# Patient Record
Sex: Female | Born: 2000 | Race: Black or African American | Hispanic: No | Marital: Single | Smoking: Never smoker
Health system: Southern US, Community
[De-identification: ages and names within clinical notes are randomized; demographics above are authoritative.]

## PROBLEM LIST (undated history)

## (undated) ENCOUNTER — Emergency Department (HOSPITAL_COMMUNITY): Admission: EM | Payer: Medicaid Other

## (undated) ENCOUNTER — Inpatient Hospital Stay (HOSPITAL_COMMUNITY): Payer: Self-pay

## (undated) DIAGNOSIS — Z30017 Encounter for initial prescription of implantable subdermal contraceptive: Secondary | ICD-10-CM

## (undated) DIAGNOSIS — T7840XA Allergy, unspecified, initial encounter: Secondary | ICD-10-CM

## (undated) DIAGNOSIS — R7303 Prediabetes: Secondary | ICD-10-CM

## (undated) DIAGNOSIS — J45909 Unspecified asthma, uncomplicated: Secondary | ICD-10-CM

## (undated) DIAGNOSIS — Z3009 Encounter for other general counseling and advice on contraception: Secondary | ICD-10-CM

## (undated) HISTORY — DX: Encounter for other general counseling and advice on contraception: Z30.09

## (undated) HISTORY — PX: NO PAST SURGERIES: SHX2092

## (undated) HISTORY — DX: Prediabetes: R73.03

## (undated) HISTORY — DX: Encounter for initial prescription of implantable subdermal contraceptive: Z30.017

---
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
## 2020-01-27 ENCOUNTER — Other Ambulatory Visit: Payer: Self-pay

## 2020-01-27 ENCOUNTER — Encounter: Payer: Self-pay | Admitting: *Deleted

## 2020-01-27 ENCOUNTER — Ambulatory Visit (INDEPENDENT_AMBULATORY_CARE_PROVIDER_SITE_OTHER): Payer: Medicaid Other | Admitting: Advanced Practice Midwife

## 2020-01-27 MED ORDER — NORETHINDRONE 0.35 MG PO TABS: 1.0000 | ORAL_TABLET | Freq: Every day | ORAL | 11 refills | Status: DC

## 2020-01-27 NOTE — Patient Instructions (Signed)
[facility]--website where you can make a virtual appointment for some counseling

## 2020-01-27 NOTE — Progress Notes (Signed)
Post Partum Visit Note  Rose Arnold is a 19 y.o. G18P1001 female who presents for a postpartum visit. She is 5 weeks postpartum following a normal spontaneous vaginal delivery.  I have fully reviewed the prenatal and intrapartum course. The delivery was at 40.2 gestational weeks.  Anesthesia: epidural. Postpartum course has been uneventful.   FOB is still sad/salty about her yelling at him during delivery.  Discussed that her actions were normal, he may have some depression issues contributing Baby is doing well. Baby is feeding by both breast and bottle - Similac Advance. Bleeding no bleeding. Bowel function is normal. Bladder function is normal. Patient is not sexually active. Contraception method is Nexplanon. Placed in hospital. Wants it removed (several reasons given:  Gained 27# (rather unlikely, clothes are still baggy), wants to have a baby in 1-2 years (discussed that taking it out now would bring her fertility back in a few weeks).  However, still wants it removed and to take POPs. Postpartum depression screening: negative.    Review of Systems Pertinent items are noted in HPI.   Objective:  Blood pressure 122/84, pulse 82, weight 127 lb 3.2 oz (57.7 kg), last menstrual period 03/14/2019, currently breastfeeding.  General:  alert, cooperative, and no distress   Breasts:  negative  Lungs: Normal respiratory effort  Heart:  regular rate and rhythm  Abdomen: soft, non-tender,    Vulva:  not evaluated  Vagina: not evaluated  Cervix:  normal  Corpus: Well involuted  Adnexa:  not evaluated  Rectal Exam: no hemorrhoids        Assessment:    Normal.  postpartum exam. Pap smear not done at today's visit.   Plan:   Essential components of care per ACOG recommendations:  1.  Mood and well being: Patient with negative depression screening today. Reviewed local resources for support.   Recommended [facility] for FOB/partner counseling.  - Patient does not use tobacco. If using tobacco we  discussed reduction and for recently cessation risk of relapse - hx of drug use? No   If yes, discussed support systems  2. Infant care and feeding:   -Patient currently breastmilk feeding? Yes If breastmilk feeding discussed return to work and pumping. If needed, patient was provided letter for work to allow for every 2-3 hr pumping breaks, and to be granted a private location to express breastmilk and refrigerated area to store breastmilk. Reviewed importance of draining breast regularly to support lactation. -Social determinants of health (SDOH) reviewed in EPIC. No concernsThe following needs were identified   3. Sexuality, contraception and birth spacing - Patient does want a pregnancy in the next year.  Desired family size is 6 children.  - Reviewed forms of contraception in tiered fashion. Patient desired oral progesterone-only contraceptive today.   - Discussed birth spacing of 18 months  4. Sleep and fatigue -Encouraged family/partner/community support of 4 hrs of uninterrupted sleep to help with mood and fatigue  5. Physical Recovery  - Discussed patients delivery and complications - Patient had a 0 degree laceration, perineal healing reviewed. Patient expressed understanding - Patient has urinary incontinence? No Patient was referred to pelvic floor PT  - Patient is safe to resume physical and sexual activity  6.  Health Maintenance - Last pap smear done N/A  7.  Nexplanon removal:   Patient given informed consent for removal of her Nexplanon, time out was performed.  Signed copy in the chart.  Appropriate time out taken. Implanon site identified.  Area prepped  in usual sterile fashon. One cc of 1% lidocaine was used to anesthetize the area at the distal end of the implant. A small stab incision was made right beside the implant on the distal portion.  The Nexplanon rod was grasped using hemostats and removed without difficulty.  There was less than 3 cc blood loss. There were  no complications.  Steri-strips were applied over the small incision.  A pressure bandage was applied to reduce any bruising.  The patient tolerated the procedure well and was given post procedure instructions.   Micronior sent to pharmacy_-strict dosage discussed, BU for 2 weeks   Jacklyn Shell, CNM [facility] [employer], [facility]

## 2020-02-24 ENCOUNTER — Ambulatory Visit: Payer: Medicaid Other | Admitting: Adult Health

## 2020-04-20 ENCOUNTER — Other Ambulatory Visit: Payer: Medicaid Other | Admitting: Women's Health

## 2020-07-22 ENCOUNTER — Other Ambulatory Visit: Payer: Medicaid Other

## 2020-10-13 ENCOUNTER — Ambulatory Visit: Payer: Medicaid Other | Admitting: Adult Health

## 2020-12-22 ENCOUNTER — Ambulatory Visit: Payer: Medicaid Other | Admitting: Women's Health

## 2021-01-18 ENCOUNTER — Ambulatory Visit: Payer: Medicaid Other | Admitting: Women's Health

## 2021-04-05 ENCOUNTER — Other Ambulatory Visit: Payer: Self-pay

## 2021-04-05 ENCOUNTER — Other Ambulatory Visit (HOSPITAL_COMMUNITY)
Admission: RE | Admit: 2021-04-05 | Discharge: 2021-04-05 | Disposition: A | Discharge status: Home / Self Care | Payer: Medicaid Other | Source: Ambulatory Visit | Attending: Women's Health | Admitting: Women's Health

## 2021-04-05 ENCOUNTER — Ambulatory Visit (INDEPENDENT_AMBULATORY_CARE_PROVIDER_SITE_OTHER): Payer: Medicaid Other | Admitting: Women's Health

## 2021-04-05 ENCOUNTER — Encounter: Payer: Self-pay | Admitting: Women's Health

## 2021-04-05 VITALS — BP 117/74 | HR 72 | Ht 59.0 in | Wt 141.0 lb

## 2021-04-05 DIAGNOSIS — Z01419 Encounter for gynecological examination (general) (routine) without abnormal findings: Secondary | ICD-10-CM | POA: Insufficient documentation

## 2021-04-05 DIAGNOSIS — Z113 Encounter for screening for infections with a predominantly sexual mode of transmission: Secondary | ICD-10-CM

## 2021-04-05 DIAGNOSIS — N926 Irregular menstruation, unspecified: Secondary | ICD-10-CM | POA: Diagnosis not present

## 2021-04-05 NOTE — Progress Notes (Signed)
WELL-WOMAN EXAMINATION Patient name: Rose Arnold MRN NO:[id number]  Date of birth: 02-04-2001 Chief Complaint:   Gynecologic Exam and Annual Exam  History of Present Illness:   Rose T Cryderman is a 21 y.o. G95P1001 Hispanic female being seen today for a routine well-woman exam.  Current complaints: Nexplanon removed 01/27/20, has had regular periods since, trying to conceive, is about 1wk late for period. Not taking pnv.   PCP: none      does not desire labs Patient's last menstrual period was 02/28/2021 (exact date). The current method of family planning is none.  Last pap never. Results were: N/A. H/O abnormal pap: no Last mammogram: never. Results were: N/A. Family h/o breast cancer: yes, MGM Last colonoscopy: never. Results were: N/A. Family h/o colorectal cancer: yes MGM  Depression screen [facility] 2/9 04/05/2021 11/15/2019 06/12/2019  Decreased Interest 0 0 0  Down, Depressed, Hopeless 0 0 0  PHQ - 2 Score 0 0 0  Altered sleeping 0 0 1  Tired, decreased energy 0 0 1  Change in appetite 0 0 1  Feeling bad or failure about yourself  0 0 0  Trouble concentrating 0 0 0  Moving slowly or fidgety/restless 0 0 0  Suicidal thoughts 0 0 0  PHQ-9 Score 0 0 3  Difficult doing work/chores - Not difficult at all Not difficult at all     GAD 7 : Generalized Anxiety Score 04/05/2021 11/15/2019 06/12/2019  Nervous, Anxious, on Edge 0 0 0  Control/stop worrying 0 0 0  Worry too much - different things 0 0 1  Trouble relaxing 0 0 0  Restless 0 0 0  Easily annoyed or irritable 0 0 0  Afraid - awful might happen 0 0 0  Total GAD 7 Score 0 0 1  Anxiety Difficulty - Not difficult at all Not difficult at all     Review of Systems:   Pertinent items are noted in HPI Denies any headaches, blurred vision, fatigue, shortness of breath, chest pain, abdominal pain, abnormal vaginal discharge/itching/odor/irritation, problems with periods, bowel movements, urination, or intercourse unless otherwise stated  above. Pertinent History Reviewed:  Reviewed past medical,surgical, social and family history.  Reviewed problem list, medications and allergies. Physical Assessment:   Vitals:   04/05/21 1057  BP: 117/74  Pulse: 72  Weight: 141 lb (64 kg)  Height: 4\' 11"  (1.499 m)  Body mass index is 28.48 kg/m.        Physical Examination:   General appearance - well appearing, and in no distress  Mental status - alert, oriented to person, place, and time  Psych:  She has a normal mood and affect  Skin - warm and dry, normal color, no suspicious lesions noted  Chest - effort normal, all lung fields clear to auscultation bilaterally  Heart - normal rate and regular rhythm  Neck:  midline trachea, no thyromegaly or nodules  Breasts - breasts appear normal, no suspicious masses, no skin or nipple changes or  axillary nodes  Abdomen - soft, nontender, nondistended, no masses or organomegaly  Pelvic - VULVA: normal appearing vulva with no masses, tenderness or lesions  VAGINA: normal appearing vagina with normal color and discharge, no lesions  CERVIX: normal appearing cervix without discharge or lesions, no CMT  Thin prep pap is done w/ HR HPV cotesting  UTERUS: uterus is felt to be normal size, shape, consistency and nontender   ADNEXA: No adnexal masses or tenderness noted.  Extremities:  No swelling or  varicosities noted  Chaperone: [employer]    No results found for this or any previous visit (from the past 24 hour(s)).  Assessment & Plan:  1) Well-Woman Exam  2) Trying to conceive, late for period> unable to void for UPT, will check bHCG, start pnv  3) STD screen> gc/ct from pap  Labs/procedures today: pap, gc/ct, UPT  Mammogram: @ 21yo, or sooner if problems Colonoscopy: @ 21yo, or sooner if problems  Orders Placed This Encounter  Procedures   Beta hCG quant (ref lab)    Meds: No orders of the defined types were placed in this encounter.   Follow-up: Return in about 1 year  (around 04/05/2022) for Physical.  [place], [facility] 04/05/2021 12:14 PM

## 2021-04-07 ENCOUNTER — Encounter: Payer: Self-pay | Admitting: Women's Health

## 2021-04-07 DIAGNOSIS — R87619 Unspecified abnormal cytological findings in specimens from cervix uteri: Secondary | ICD-10-CM | POA: Insufficient documentation

## 2021-04-07 LAB — CYTOLOGY - PAP
Chlamydia: NEGATIVE
Comment: NEGATIVE
Comment: NORMAL
Neisseria Gonorrhea: NEGATIVE

## 2021-10-27 ENCOUNTER — Other Ambulatory Visit: Payer: Medicaid Other

## 2022-01-13 ENCOUNTER — Other Ambulatory Visit: Payer: Self-pay

## 2022-01-13 ENCOUNTER — Emergency Department (HOSPITAL_COMMUNITY)
Admission: EM | Admit: 2022-01-13 | Discharge: 2022-01-13 | Disposition: A | Discharge status: Home / Self Care | Payer: Medicaid Other | Attending: Emergency Medicine | Admitting: Emergency Medicine

## 2022-01-13 ENCOUNTER — Encounter (HOSPITAL_COMMUNITY): Payer: Self-pay | Admitting: Emergency Medicine

## 2022-01-13 DIAGNOSIS — N3000 Acute cystitis without hematuria: Secondary | ICD-10-CM | POA: Diagnosis not present

## 2022-01-13 DIAGNOSIS — B9689 Other specified bacterial agents as the cause of diseases classified elsewhere: Secondary | ICD-10-CM | POA: Diagnosis not present

## 2022-01-13 DIAGNOSIS — A599 Trichomoniasis, unspecified: Secondary | ICD-10-CM | POA: Insufficient documentation

## 2022-01-13 DIAGNOSIS — J45909 Unspecified asthma, uncomplicated: Secondary | ICD-10-CM | POA: Diagnosis not present

## 2022-01-13 DIAGNOSIS — Z202 Contact with and (suspected) exposure to infections with a predominantly sexual mode of transmission: Secondary | ICD-10-CM | POA: Insufficient documentation

## 2022-01-13 DIAGNOSIS — N760 Acute vaginitis: Secondary | ICD-10-CM | POA: Diagnosis not present

## 2022-01-13 DIAGNOSIS — R1032 Left lower quadrant pain: Secondary | ICD-10-CM | POA: Diagnosis present

## 2022-01-13 DIAGNOSIS — Z113 Encounter for screening for infections with a predominantly sexual mode of transmission: Secondary | ICD-10-CM | POA: Diagnosis not present

## 2022-01-13 LAB — CBC WITH DIFFERENTIAL/PLATELET
Abs Immature Granulocytes: 0.01 10*3/uL (ref 0.00–0.07)
Basophils Absolute: 0 10*3/uL (ref 0.0–0.1)
Basophils Relative: 0 %
Eosinophils Absolute: 0.2 10*3/uL (ref 0.0–0.5)
Eosinophils Relative: 5 %
HCT: 39.6 % (ref 36.0–46.0)
Hemoglobin: 13 g/dL (ref 12.0–15.0)
Immature Granulocytes: 0 %
Lymphocytes Relative: 30 %
Lymphs Abs: 1.4 10*3/uL (ref 0.7–4.0)
MCH: 29.8 pg (ref 26.0–34.0)
MCHC: 32.8 g/dL (ref 30.0–36.0)
MCV: 90.8 fL (ref 80.0–100.0)
Monocytes Absolute: 0.4 10*3/uL (ref 0.1–1.0)
Monocytes Relative: 9 %
Neutro Abs: 2.7 10*3/uL (ref 1.7–7.7)
Neutrophils Relative %: 56 %
Platelets: 286 10*3/uL (ref 150–400)
RBC: 4.36 MIL/uL (ref 3.87–5.11)
RDW: 12.9 % (ref 11.5–15.5)
WBC: 4.7 10*3/uL (ref 4.0–10.5)
nRBC: 0 % (ref 0.0–0.2)

## 2022-01-13 LAB — COMPREHENSIVE METABOLIC PANEL
ALT: 161 U/L — ABNORMAL HIGH (ref 0–44)
AST: 142 U/L — ABNORMAL HIGH (ref 15–41)
Albumin: 3.9 g/dL (ref 3.5–5.0)
Alkaline Phosphatase: 103 U/L (ref 38–126)
Anion gap: 9 (ref 5–15)
BUN: 8 mg/dL (ref 6–20)
CO2: 26 mmol/L (ref 22–32)
Calcium: 9.2 mg/dL (ref 8.9–10.3)
Chloride: 103 mmol/L (ref 98–111)
Creatinine, Ser: 0.79 mg/dL (ref 0.44–1.00)
GFR, Estimated: >60 mL/min (ref >60)
Glucose, Bld: 91 mg/dL (ref 70–99)
Potassium: 3.9 mmol/L (ref 3.5–5.1)
Sodium: 138 mmol/L (ref 135–145)
Total Bilirubin: 0.3 mg/dL (ref 0.3–1.2)
Total Protein: 8.3 g/dL — ABNORMAL HIGH (ref 6.5–8.1)

## 2022-01-13 LAB — URINALYSIS, ROUTINE W REFLEX MICROSCOPIC
Bilirubin Urine: NEGATIVE
Glucose, UA: NEGATIVE mg/dL
Hgb urine dipstick: NEGATIVE
Ketones, ur: NEGATIVE mg/dL
Nitrite: NEGATIVE
Protein, ur: NEGATIVE mg/dL
Specific Gravity, Urine: 1.018 (ref 1.005–1.030)
pH: 7 (ref 5.0–8.0)

## 2022-01-13 LAB — WET PREP, GENITAL
Sperm: NONE SEEN
WBC, Wet Prep HPF POC: >=10 — AB (ref <10)
Yeast Wet Prep HPF POC: NONE SEEN

## 2022-01-13 LAB — POC URINE PREG, ED: Preg Test, Ur: NEGATIVE

## 2022-01-13 LAB — LIPASE, BLOOD: Lipase: 29 U/L (ref 11–51)

## 2022-01-13 MED ORDER — KETOROLAC TROMETHAMINE 15 MG/ML IJ SOLN
15.0000 mg | Freq: Once | INTRAMUSCULAR | Status: AC
  Administered 2022-01-13: 15 mg via INTRAMUSCULAR
  Filled 2022-01-13: qty 1

## 2022-01-13 MED ORDER — METRONIDAZOLE 500 MG PO TABS: 500.0000 mg | ORAL_TABLET | Freq: Two times a day (BID) | ORAL | 0 refills | Status: DC

## 2022-01-13 MED ORDER — CEFTRIAXONE SODIUM 500 MG IJ SOLR
500.0000 mg | Freq: Once | INTRAMUSCULAR | Status: AC
  Administered 2022-01-13: 500 mg via INTRAMUSCULAR
  Filled 2022-01-13: qty 500

## 2022-01-13 MED ORDER — METRONIDAZOLE 500 MG PO TABS
500.0000 mg | ORAL_TABLET | Freq: Once | ORAL | Status: AC
  Administered 2022-01-13: 500 mg via ORAL
  Filled 2022-01-13: qty 1

## 2022-01-13 MED ORDER — DOXYCYCLINE HYCLATE 100 MG PO TABS
100.0000 mg | ORAL_TABLET | Freq: Once | ORAL | Status: AC
  Administered 2022-01-13: 100 mg via ORAL
  Filled 2022-01-13: qty 1

## 2022-01-13 MED ORDER — DOXYCYCLINE HYCLATE 100 MG PO CAPS: 100.0000 mg | ORAL_CAPSULE | Freq: Two times a day (BID) | ORAL | 0 refills | Status: AC

## 2022-01-13 NOTE — Discharge Instructions (Addendum)
Your workup today is positive for trichomonas, bacterial vaginosis.  Given your symptoms we discussed concern for gonorrhea and chlamydia and after our discussion you elected to go ahead and get treated instead of waiting for results which will take 2 to 3 days.  He can follow-up on these results on your MyChart.  He received a shot of antibiotic in the emergency room.  I have sent 2 additional antibiotics into the pharmacy that we will cover chlamydia, UTI with doxycycline, bacterial vaginosis and trichomoniasis with Flagyl.  For any concerning symptoms please return to the emergency room.  Have given information to establish care with [facility], the health and wellness clinic.  Follow-up with your GYN.  Avoid sexual activity until 7 days after you complete the doxycycline.  Please take doxycycline with food.

## 2022-01-13 NOTE — ED Triage Notes (Addendum)
Pt via POV c/o LLQ abd pain x 2 days with no other associated symptoms. Denies urinary sx but states that she has noticed a "very strong smell" to her urine.

## 2022-01-13 NOTE — ED Provider Notes (Signed)
[facility] EMERGENCY DEPARTMENT Provider Note   CSN: [id number] Arrival date & time: 01/13/22  1535     History  Chief Complaint  Patient presents with   Abdominal Pain    Rose Arnold is a 21 y.o. female.  21 year old female with no significant past medical history presents today for evaluation of left lower quadrant abdominal pain that has been ongoing for the past week however worse since this morning.  She states over the past week its mostly felt like a cramp however today she states is slightly worse and rates it as a 4/10 but states that it is uncomfortable.  She states since yesterday she has noted urinary frequency, foul smell.  She denies nausea, vomiting, constipation, diarrhea, fever.  Endorses new sexual partner within the past month.  Not using protection currently.  Endorses vaginal discharge over the past week.  Denies vaginal bleeding, flank pain.  LMP ended on 11/24.  The history is provided by the patient. No language interpreter was used.       Home Medications Prior to Admission medications   Medication Sig Start Date End Date Taking? Authorizing Provider  Blood Pressure Monitor MISC For regular home bp monitoring during pregnancy 06/12/19   Cheral Marker, CNM  coconut oil OIL Apply 1 application topically as needed. 12/24/19   Alric Seton, MD      Allergies    Patient has no known allergies.    Review of Systems   Review of Systems  Constitutional:  Negative for chills and fever.  Gastrointestinal:  Positive for abdominal pain. Negative for constipation, diarrhea, nausea and vomiting.  Genitourinary:  Positive for pelvic pain and vaginal discharge. Negative for difficulty urinating, dysuria, flank pain, vaginal bleeding and vaginal pain.  All other systems reviewed and are negative.   Physical Exam Updated Vital Signs BP (!) 143/101 (BP Location: Right Arm)   Pulse 79   Temp 98.2 F (36.8 C) (Oral)   Resp 15   Ht 4' 11.5" (1.511 m)    Wt 66.2 kg   SpO2 100%   BMI 28.99 kg/m  Physical Exam Vitals and nursing note reviewed. Exam conducted with a chaperone present.  Constitutional:      General: She is not in acute distress.    Appearance: Normal appearance. She is not ill-appearing.  HENT:     Head: Normocephalic and atraumatic.     Nose: Nose normal.  Eyes:     General: No scleral icterus.    Extraocular Movements: Extraocular movements intact.     Conjunctiva/sclera: Conjunctivae normal.  Cardiovascular:     Rate and Rhythm: Normal rate and regular rhythm.     Pulses: Normal pulses.  Pulmonary:     Effort: Pulmonary effort is normal. No respiratory distress.     Breath sounds: Normal breath sounds. No wheezing or rales.  Abdominal:     General: There is no distension.     Palpations: Abdomen is soft.     Tenderness: There is abdominal tenderness (LLQ). There is no right CVA tenderness, left CVA tenderness, guarding or rebound.  Genitourinary:    Exam position: Lithotomy position.     Vagina: Normal. No vaginal discharge.     Cervix: No cervical motion tenderness, discharge, lesion, erythema or cervical bleeding.     Adnexa: Right adnexa normal and left adnexa normal.       Right: No tenderness.         Left: No tenderness.    Musculoskeletal:  General: Normal range of motion.     Cervical back: Normal range of motion.  Skin:    General: Skin is warm and dry.  Neurological:     General: No focal deficit present.     Mental Status: She is alert. Mental status is at baseline.     ED Results / Procedures / Treatments   Labs (all labs ordered are listed, but only abnormal results are displayed) Labs Reviewed  WET PREP, GENITAL  CBC WITH DIFFERENTIAL/PLATELET  LIPASE, BLOOD  COMPREHENSIVE METABOLIC PANEL  URINALYSIS, ROUTINE W REFLEX MICROSCOPIC  RPR  POC URINE PREG, ED  GC/CHLAMYDIA PROBE AMP ([place]) NOT AT [facility]    EKG None  Radiology No results  found.  Procedures Procedures    Medications Ordered in ED Medications  ketorolac (TORADOL) 15 MG/ML injection 15 mg (15 mg Intramuscular Given 01/13/22 1643)    ED Course/ Medical Decision Making/ A&P                           Medical Decision Making Amount and/or Complexity of Data Reviewed Labs: ordered.  Risk Prescription drug management.   Medical Decision Making / ED Course   This patient presents to the ED for concern of left lower quadrant abdominal pain, this involves an extensive number of treatment options, and is a complaint that carries with it a high risk of complications and morbidity.  The differential diagnosis includes UTI, pyelonephritis, STI, PID, diverticulitis, pancreatitis, gastroenteritis, appendicitis  MDM: 21 year old female presents with above-mentioned complaints.  Overall she is well-appearing.  Does have left lower quadrant abdominal tenderness without guarding.  Without CVA tenderness.  Recent change in sexual partner.  Pelvic exam without CMT, or erythema of the cervix.  Given afebrile, without leukocytosis, without CMT low suspicion for PID.  UA with some evidence of UTI, wet prep with evidence of trichomonas, BV.  Will empirically cover for GC and chlamydia as well after shared decision making.  LFTs elevated.  Patient endorses history of excessive alcohol use.  Given she is without right upper quadrant abdominal tenderness, without leukocytosis, and afebrile low suspicion for acute cholecystitis or other biliary disease.  Return precaution discussed if she develops right upper quadrant pain.  Will provide referral to internal medicine to establish care.  Milligrams Rocephin, doxycycline for chlamydia, Flagyl for BV and trichomoniasis.  Strict return precaution discussed.  Patient voices understanding.  She is in agreement with plan.  She does have a GYN she will follow-up with.   Lab Tests: -I ordered, reviewed, and interpreted labs.   The pertinent  results include:   Labs Reviewed  WET PREP, GENITAL - Abnormal; Notable for the following components:      Result Value   Trich, Wet Prep PRESENT (*)    Clue Cells Wet Prep HPF POC PRESENT (*)    WBC, Wet Prep HPF POC >=10 (*)    All other components within normal limits  COMPREHENSIVE METABOLIC PANEL - Abnormal; Notable for the following components:   Total Protein 8.3 (*)    AST 142 (*)    ALT 161 (*)    All other components within normal limits  URINALYSIS, ROUTINE W REFLEX MICROSCOPIC - Abnormal; Notable for the following components:   APPearance HAZY (*)    Leukocytes,Ua MODERATE (*)    Bacteria, UA RARE (*)    All other components within normal limits  LIPASE, BLOOD  CBC WITH DIFFERENTIAL/PLATELET  RPR  POC  URINE PREG, ED  GC/CHLAMYDIA PROBE AMP ([place]) NOT AT [facility]      EKG  EKG Interpretation  Date/Time:    Ventricular Rate:    PR Interval:    QRS Duration:   QT Interval:    QTC Calculation:   R Axis:     Text Interpretation:          Medicines ordered and prescription drug management: Meds ordered this encounter  Medications   ketorolac (TORADOL) 15 MG/ML injection 15 mg   metroNIDAZOLE (FLAGYL) tablet 500 mg   cefTRIAXone (ROCEPHIN) injection 500 mg    Order Specific Question:   Antibiotic Indication:    Answer:   STD   doxycycline (VIBRA-TABS) tablet 100 mg   doxycycline (VIBRAMYCIN) 100 MG capsule    Sig: Take 1 capsule (100 mg total) by mouth 2 (two) times daily for 7 days.    Dispense:  14 capsule    Refill:  0    Order Specific Question:   Supervising Provider    Answer:   MILLER, BRIAN [[id number]]   metroNIDAZOLE (FLAGYL) 500 MG tablet    Sig: Take 1 tablet (500 mg total) by mouth 2 (two) times daily.    Dispense:  14 tablet    Refill:  0    Order Specific Question:   Supervising Provider    Answer:   Hyacinth Meeker, BRIAN [[id number]]    -I have reviewed the patients home medicines and have made adjustments as needed  Co morbidities that complicate  the patient evaluation  Past Medical History:  Diagnosis Date   Allergy    Seasonal   Asthma    Encounter for counseling regarding initiation of other contraceptive measure 10/01/2014   Gonorrhea 03/13/2019   Treated 03/13/19 with rocephin and azithromycin:   POC__________   Nexplanon insertion 04/10/2015   Inserted 04/10/15 left arm   Pre-diabetes       Dispostion: Patient is appropriate for discharge.  Discharged in stable condition.    Final Clinical Impression(s) / ED Diagnoses Final diagnoses:  Acute cystitis without hematuria  BV (bacterial vaginosis)  Trichimoniasis  Exposure to sexually transmitted disease (STD)    Rx / DC Orders ED Discharge Orders          Ordered    doxycycline (VIBRAMYCIN) 100 MG capsule  2 times daily        01/13/22 1859    metroNIDAZOLE (FLAGYL) 500 MG tablet  2 times daily        01/13/22 1859              Marita Kansas, PA-C 01/13/22 1900    Rexford Maus, DO 01/13/22 2340

## 2022-01-14 LAB — GC/CHLAMYDIA PROBE AMP (~~LOC~~) NOT AT ARMC
Chlamydia: POSITIVE — AB
Comment: NEGATIVE
Comment: NORMAL
Neisseria Gonorrhea: NEGATIVE

## 2022-01-14 LAB — RPR: RPR Ser Ql: NONREACTIVE

## 2022-03-26 ENCOUNTER — Emergency Department (HOSPITAL_COMMUNITY)
Admission: EM | Admit: 2022-03-26 | Discharge: 2022-03-27 | Disposition: A | Discharge status: Home / Self Care | Payer: Medicaid Other | Attending: Emergency Medicine | Admitting: Emergency Medicine

## 2022-03-26 DIAGNOSIS — R110 Nausea: Secondary | ICD-10-CM | POA: Insufficient documentation

## 2022-03-26 DIAGNOSIS — N730 Acute parametritis and pelvic cellulitis: Secondary | ICD-10-CM

## 2022-03-26 DIAGNOSIS — Z1152 Encounter for screening for COVID-19: Secondary | ICD-10-CM | POA: Diagnosis not present

## 2022-03-26 DIAGNOSIS — N739 Female pelvic inflammatory disease, unspecified: Secondary | ICD-10-CM | POA: Insufficient documentation

## 2022-03-26 LAB — CBC WITH DIFFERENTIAL/PLATELET
Abs Immature Granulocytes: 0.03 10*3/uL (ref 0.00–0.07)
Basophils Absolute: 0 10*3/uL (ref 0.0–0.1)
Basophils Relative: 0 %
Eosinophils Absolute: 0.6 10*3/uL — ABNORMAL HIGH (ref 0.0–0.5)
Eosinophils Relative: 9 %
HCT: 37.8 % (ref 36.0–46.0)
Hemoglobin: 12.5 g/dL (ref 12.0–15.0)
Immature Granulocytes: 1 %
Lymphocytes Relative: 19 %
Lymphs Abs: 1.1 10*3/uL (ref 0.7–4.0)
MCH: 28.8 pg (ref 26.0–34.0)
MCHC: 33.1 g/dL (ref 30.0–36.0)
MCV: 87.1 fL (ref 80.0–100.0)
Monocytes Absolute: 0.6 10*3/uL (ref 0.1–1.0)
Monocytes Relative: 11 %
Neutro Abs: 3.5 10*3/uL (ref 1.7–7.7)
Neutrophils Relative %: 60 %
Platelets: 171 10*3/uL (ref 150–400)
RBC: 4.34 MIL/uL (ref 3.87–5.11)
RDW: 13 % (ref 11.5–15.5)
WBC: 5.8 10*3/uL (ref 4.0–10.5)
nRBC: 0 % (ref 0.0–0.2)

## 2022-03-26 LAB — WET PREP, GENITAL
Sperm: NONE SEEN
Trich, Wet Prep: NONE SEEN
WBC, Wet Prep HPF POC: <10 (ref <10)
Yeast Wet Prep HPF POC: NONE SEEN

## 2022-03-26 LAB — BASIC METABOLIC PANEL
Anion gap: 10 (ref 5–15)
BUN: 10 mg/dL (ref 6–20)
CO2: 22 mmol/L (ref 22–32)
Calcium: 8.5 mg/dL — ABNORMAL LOW (ref 8.9–10.3)
Chloride: 102 mmol/L (ref 98–111)
Creatinine, Ser: 0.9 mg/dL (ref 0.44–1.00)
GFR, Estimated: >60 mL/min (ref >60)
Glucose, Bld: 100 mg/dL — ABNORMAL HIGH (ref 70–99)
Potassium: 3.4 mmol/L — ABNORMAL LOW (ref 3.5–5.1)
Sodium: 134 mmol/L — ABNORMAL LOW (ref 135–145)

## 2022-03-26 LAB — LACTIC ACID, PLASMA: Lactic Acid, Venous: 0.9 mmol/L (ref 0.5–1.9)

## 2022-03-26 MED ORDER — DOXYCYCLINE HYCLATE 100 MG PO TABS
100.0000 mg | ORAL_TABLET | Freq: Once | ORAL | Status: AC
  Administered 2022-03-27: 100 mg via ORAL
  Filled 2022-03-26: qty 1

## 2022-03-26 MED ORDER — DOXYCYCLINE HYCLATE 100 MG PO CAPS: 100.0000 mg | ORAL_CAPSULE | Freq: Two times a day (BID) | ORAL | 0 refills | Status: AC

## 2022-03-26 MED ORDER — METRONIDAZOLE 500 MG PO TABS: 500.0000 mg | ORAL_TABLET | Freq: Two times a day (BID) | ORAL | 0 refills | Status: AC

## 2022-03-26 MED ORDER — SODIUM CHLORIDE 0.9 % IV BOLUS
1000.0000 mL | Freq: Once | INTRAVENOUS | Status: AC
  Administered 2022-03-26: 1000 mL via INTRAVENOUS

## 2022-03-26 MED ORDER — CEFTRIAXONE SODIUM 500 MG IJ SOLR: 500.0000 mg | Freq: Once | INTRAMUSCULAR | Status: DC

## 2022-03-26 MED ORDER — ACETAMINOPHEN 500 MG PO TABS
1000.0000 mg | ORAL_TABLET | Freq: Once | ORAL | Status: AC
  Administered 2022-03-26: 1000 mg via ORAL
  Filled 2022-03-26: qty 2

## 2022-03-26 MED ORDER — METRONIDAZOLE 500 MG PO TABS
500.0000 mg | ORAL_TABLET | Freq: Once | ORAL | Status: AC
  Administered 2022-03-27: 500 mg via ORAL
  Filled 2022-03-26: qty 1

## 2022-03-26 NOTE — Discharge Instructions (Addendum)
Please follow-up with your gynecologist for re-evaluation of your symptoms and response to treatment.  We are treating you for pelvic inflammatory disease.  You received an IM antibiotic in the ED.  The other 2 antibiotics you will take by mouth for the next 14 days.  Do not consume alcohol while taking metronidazole (Flagyl).

## 2022-03-26 NOTE — ED Triage Notes (Signed)
Pt reports vaginal tear 1 week ago after intercourse. Reports progressively getting worse and increased bleeding.
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
## 2022-03-26 NOTE — ED Notes (Signed)
Patient emotional and tearful in room with complaint of tear in her vaginal area

## 2022-03-27 LAB — RESP PANEL BY RT-PCR (RSV, FLU A&B, COVID)  RVPGX2
Influenza A by PCR: NEGATIVE
Influenza B by PCR: NEGATIVE
Resp Syncytial Virus by PCR: NEGATIVE
SARS Coronavirus 2 by RT PCR: NEGATIVE

## 2022-03-27 LAB — URINALYSIS, ROUTINE W REFLEX MICROSCOPIC
Bacteria, UA: NONE SEEN
Bilirubin Urine: NEGATIVE
Glucose, UA: NEGATIVE mg/dL
Ketones, ur: 20 mg/dL — AB
Nitrite: NEGATIVE
Protein, ur: 100 mg/dL — AB
Specific Gravity, Urine: 1.023 (ref 1.005–1.030)
pH: 6 (ref 5.0–8.0)

## 2022-03-27 LAB — HIV ANTIBODY (ROUTINE TESTING W REFLEX): HIV Screen 4th Generation wRfx: NONREACTIVE

## 2022-03-27 LAB — PREGNANCY, URINE: Preg Test, Ur: NEGATIVE

## 2022-03-27 MED ORDER — KETOROLAC TROMETHAMINE 30 MG/ML IJ SOLN
30.0000 mg | Freq: Once | INTRAMUSCULAR | Status: AC
  Administered 2022-03-27: 30 mg via INTRAVENOUS
  Filled 2022-03-27: qty 1

## 2022-03-27 MED ORDER — STERILE WATER FOR INJECTION IJ SOLN
INTRAMUSCULAR | Status: AC
  Filled 2022-03-27: qty 10

## 2022-03-27 MED ORDER — DEXTROSE 5 % IV SOLN: 500.0000 mg | Freq: Once | INTRAVENOUS | Status: DC

## 2022-03-27 MED ORDER — CEFTRIAXONE SODIUM 500 MG IJ SOLR
500.0000 mg | Freq: Once | INTRAMUSCULAR | Status: AC
  Administered 2022-03-27: 500 mg via INTRAMUSCULAR
  Filled 2022-03-27: qty 500

## 2022-03-28 LAB — GC/CHLAMYDIA PROBE AMP (~~LOC~~) NOT AT ARMC
Chlamydia: NEGATIVE
Comment: NEGATIVE
Comment: NORMAL
Neisseria Gonorrhea: NEGATIVE

## 2022-07-18 ENCOUNTER — Other Ambulatory Visit (HOSPITAL_COMMUNITY)
Admission: RE | Admit: 2022-07-18 | Discharge: 2022-07-18 | Disposition: A | Discharge status: Home / Self Care | Payer: Medicaid Other | Source: Ambulatory Visit | Attending: Women's Health | Admitting: Women's Health

## 2022-07-18 ENCOUNTER — Ambulatory Visit (INDEPENDENT_AMBULATORY_CARE_PROVIDER_SITE_OTHER): Payer: Medicaid Other | Admitting: Women's Health

## 2022-07-18 ENCOUNTER — Encounter: Payer: Self-pay | Admitting: Women's Health

## 2022-07-18 VITALS — BP 121/85 | HR 75 | Ht 59.0 in | Wt 149.0 lb

## 2022-07-18 DIAGNOSIS — Z124 Encounter for screening for malignant neoplasm of cervix: Secondary | ICD-10-CM

## 2022-07-18 DIAGNOSIS — Z8742 Personal history of other diseases of the female genital tract: Secondary | ICD-10-CM | POA: Diagnosis not present

## 2022-07-18 DIAGNOSIS — Z319 Encounter for procreative management, unspecified: Secondary | ICD-10-CM | POA: Diagnosis not present

## 2022-07-18 NOTE — Patient Instructions (Signed)
If you are trying to get pregnant:  Have sex every other day on days 7-24 of your cycle (Day 1 is the 1st day of your period) Pee before sex Lay with your hips elevated on pillows for 20-30mins after sex Do not smoke or drink alcohol Lose weight if you are overweight Take a prenatal vitamin with at least 400mcg of folic acid Decrease stress in your life  For Him:  Wear boxers instead of briefs Avoid hot baths/jacuzzi Vit C supplement Do not smoke or drink alcohol Lose weight if you are overweight    

## 2022-07-18 NOTE — Progress Notes (Signed)
GYN VISIT Patient name: Rose Arnold MRN [id number]  Date of birth: Jul 02, 2000 Chief Complaint:   talk about conceiving  History of Present Illness:   Rose Arnold is a 22 y.o. G88P1001 African-American female being seen today to discuss getting pregnant. Has been trying to conceive since Nexplanon removed 01/27/20. Regular periods q 27d, usually last 5d, past 2 months has only lasted 3d. No cramping, bloating, breast tenderness. OPKs have been positive x 1 yr (except for once in Feb), is tracking periods, having sex on fertile days. No problems conceiving her last child that is 2.5yo. This is a different partner, he's 25yo, has 1 daughter that is almost 5yo. Not currently taking pnv.   Patient's last menstrual period was 07/12/2022. The current method of family planning is none.  Last pap 04/05/21. Results were: LSIL w/ HRHPV not done     04/05/2021   10:53 AM 11/15/2019   10:40 AM 06/12/2019   11:46 AM  Depression screen PHQ 2/9  Decreased Interest 0 0 0  Down, Depressed, Hopeless 0 0 0  PHQ - 2 Score 0 0 0  Altered sleeping 0 0 1  Tired, decreased energy 0 0 1  Change in appetite 0 0 1  Feeling bad or failure about yourself  0 0 0  Trouble concentrating 0 0 0  Moving slowly or fidgety/restless 0 0 0  Suicidal thoughts 0 0 0  PHQ-9 Score 0 0 3  Difficult doing work/chores  Not difficult at all Not difficult at all        04/05/2021   10:53 AM 11/15/2019   10:41 AM 06/12/2019   11:46 AM  GAD 7 : Generalized Anxiety Score  Nervous, Anxious, on Edge 0 0 0  Control/stop worrying 0 0 0  Worry too much - different things 0 0 1  Trouble relaxing 0 0 0  Restless 0 0 0  Easily annoyed or irritable 0 0 0  Afraid - awful might happen 0 0 0  Total GAD 7 Score 0 0 1  Anxiety Difficulty  Not difficult at all Not difficult at all     Review of Systems:   Pertinent items are noted in HPI Denies fever/chills, dizziness, headaches, visual disturbances, fatigue, shortness of breath,  chest pain, abdominal pain, vomiting, abnormal vaginal discharge/itching/odor/irritation, problems with periods, bowel movements, urination, or intercourse unless otherwise stated above.  Pertinent History Reviewed:  Reviewed past medical,surgical, social, obstetrical and family history.  Reviewed problem list, medications and allergies. Physical Assessment:   Vitals:   07/18/22 1508  BP: 121/85  Pulse: 75  Weight: 149 lb (67.6 kg)  Height: 4\' 11"  (1.499 m)  Body mass index is 30.09 kg/m.       Physical Examination:   General appearance: alert, well appearing, and in no distress  Mental status: alert, oriented to person, place, and time  Skin: warm & dry   Cardiovascular: normal heart rate noted  Respiratory: normal respiratory effort, no distress  Abdomen: soft, non-tender   Pelvic: VULVA: normal appearing vulva with no masses, tenderness or lesions, VAGINA: normal appearing vagina with normal color and discharge, no lesions, CERVIX: normal appearing cervix without discharge or lesions  Extremities: no edema   Chaperone: Peggy Dones    No results found for this or any previous visit (from the past 24 hour(s)).  Assessment & Plan:  1) Trying to conceive> x2.41yrs w/ new partner. Will check day 21 progesterone (6/17). Start pnv, gave tips on conceiving, f/u   2) H/O abnormal pap> repeat today  Meds: No orders of the defined types were placed in this encounter.   Orders Placed This Encounter  Procedures   Progesterone    Return in about 3 months (around 10/18/2022) for GYN f/u, in person.  Cheral Marker CNM, [facility] 07/18/2022 3:44 PM

## 2022-07-25 LAB — CYTOLOGY - PAP
Chlamydia: NEGATIVE
Comment: NEGATIVE
Comment: NEGATIVE
Comment: NORMAL
Diagnosis: NEGATIVE
High risk HPV: NEGATIVE
Neisseria Gonorrhea: NEGATIVE

## 2022-08-01 DIAGNOSIS — Z319 Encounter for procreative management, unspecified: Secondary | ICD-10-CM | POA: Diagnosis not present

## 2022-08-02 ENCOUNTER — Encounter: Payer: Self-pay | Admitting: Women's Health

## 2022-08-02 LAB — PROGESTERONE: Progesterone: 9.9 ng/mL

## 2022-08-17 ENCOUNTER — Encounter: Payer: Self-pay | Admitting: *Deleted

## 2022-08-17 ENCOUNTER — Ambulatory Visit (INDEPENDENT_AMBULATORY_CARE_PROVIDER_SITE_OTHER): Payer: Medicaid Other | Admitting: *Deleted

## 2022-08-17 DIAGNOSIS — N926 Irregular menstruation, unspecified: Secondary | ICD-10-CM

## 2022-08-17 LAB — POCT URINE PREGNANCY: Preg Test, Ur: POSITIVE — AB

## 2022-08-17 NOTE — Progress Notes (Signed)
   NURSE VISIT- PREGNANCY CONFIRMATION   SUBJECTIVE:  Rose Arnold is a 22 y.o. G2P1001 female at [redacted]w[redacted]d by certain LMP of Patient's last menstrual period was 07/12/2022. Here for pregnancy confirmation.  Home pregnancy test: positive x 1   She reports no complaints.  She is taking prenatal vitamins.    OBJECTIVE:  LMP 07/12/2022   Breastfeeding No   Appears well, in no apparent distress  No results found for this or any previous visit (from the past 24 hour(s)).  ASSESSMENT: Positive pregnancy test, [redacted]w[redacted]d by LMP    PLAN: Schedule for dating ultrasound in 3 weeks Prenatal vitamins: continue   Nausea medicines: not currently needed   OB packet given: Yes  Annamarie Dawley  08/17/2022 9:34 AM

## 2022-09-06 ENCOUNTER — Other Ambulatory Visit: Payer: Self-pay | Admitting: Obstetrics & Gynecology

## 2022-09-06 ENCOUNTER — Other Ambulatory Visit: Payer: Self-pay | Admitting: Women's Health

## 2022-09-06 DIAGNOSIS — Z3401 Encounter for supervision of normal first pregnancy, first trimester: Secondary | ICD-10-CM

## 2022-09-06 DIAGNOSIS — O3680X0 Pregnancy with inconclusive fetal viability, not applicable or unspecified: Secondary | ICD-10-CM

## 2022-09-07 ENCOUNTER — Ambulatory Visit (INDEPENDENT_AMBULATORY_CARE_PROVIDER_SITE_OTHER): Payer: Medicaid Other

## 2022-09-07 DIAGNOSIS — Z3481 Encounter for supervision of other normal pregnancy, first trimester: Secondary | ICD-10-CM | POA: Diagnosis not present

## 2022-09-07 DIAGNOSIS — Z3A08 8 weeks gestation of pregnancy: Secondary | ICD-10-CM

## 2022-09-07 DIAGNOSIS — O3680X0 Pregnancy with inconclusive fetal viability, not applicable or unspecified: Secondary | ICD-10-CM

## 2022-09-07 NOTE — Progress Notes (Signed)
[place] 8+1 wks,single IUP with yolk,FHR 178 bpm,normal ovaries,CRL 19.30 mm

## 2022-10-05 ENCOUNTER — Encounter (HOSPITAL_COMMUNITY): Payer: Self-pay

## 2022-10-05 ENCOUNTER — Emergency Department (HOSPITAL_COMMUNITY)
Admission: EM | Admit: 2022-10-05 | Discharge: 2022-10-06 | Disposition: A | Discharge status: Home / Self Care | Payer: Medicaid Other | Attending: Emergency Medicine | Admitting: Emergency Medicine

## 2022-10-05 DIAGNOSIS — Z3A12 12 weeks gestation of pregnancy: Secondary | ICD-10-CM | POA: Insufficient documentation

## 2022-10-05 DIAGNOSIS — D72829 Elevated white blood cell count, unspecified: Secondary | ICD-10-CM | POA: Diagnosis not present

## 2022-10-05 DIAGNOSIS — O2301 Infections of kidney in pregnancy, first trimester: Secondary | ICD-10-CM | POA: Insufficient documentation

## 2022-10-05 DIAGNOSIS — O26891 Other specified pregnancy related conditions, first trimester: Secondary | ICD-10-CM | POA: Diagnosis present

## 2022-10-05 DIAGNOSIS — N12 Tubulo-interstitial nephritis, not specified as acute or chronic: Secondary | ICD-10-CM | POA: Diagnosis not present

## 2022-10-05 LAB — URINALYSIS, ROUTINE W REFLEX MICROSCOPIC
Bilirubin Urine: NEGATIVE
Glucose, UA: NEGATIVE mg/dL
Hgb urine dipstick: NEGATIVE
Ketones, ur: 20 mg/dL — AB
Nitrite: POSITIVE — AB
Protein, ur: NEGATIVE mg/dL
Specific Gravity, Urine: 1.008 (ref 1.005–1.030)
pH: 6 (ref 5.0–8.0)

## 2022-10-05 LAB — CBC WITH DIFFERENTIAL/PLATELET
Abs Immature Granulocytes: 0.03 10*3/uL (ref 0.00–0.07)
Basophils Absolute: 0 10*3/uL (ref 0.0–0.1)
Basophils Relative: 0 %
Eosinophils Absolute: 0 10*3/uL (ref 0.0–0.5)
Eosinophils Relative: 0 %
HCT: 33.5 % — ABNORMAL LOW (ref 36.0–46.0)
Hemoglobin: 11.2 g/dL — ABNORMAL LOW (ref 12.0–15.0)
Immature Granulocytes: 0 %
Lymphocytes Relative: 9 %
Lymphs Abs: 1 10*3/uL (ref 0.7–4.0)
MCH: 28.9 pg (ref 26.0–34.0)
MCHC: 33.4 g/dL (ref 30.0–36.0)
MCV: 86.6 fL (ref 80.0–100.0)
Monocytes Absolute: 0.7 10*3/uL (ref 0.1–1.0)
Monocytes Relative: 6 %
Neutro Abs: 9.9 10*3/uL — ABNORMAL HIGH (ref 1.7–7.7)
Neutrophils Relative %: 85 %
Platelets: 210 10*3/uL (ref 150–400)
RBC: 3.87 MIL/uL (ref 3.87–5.11)
RDW: 13 % (ref 11.5–15.5)
WBC: 11.7 10*3/uL — ABNORMAL HIGH (ref 4.0–10.5)
nRBC: 0 % (ref 0.0–0.2)

## 2022-10-05 LAB — COMPREHENSIVE METABOLIC PANEL
ALT: 19 U/L (ref 0–44)
AST: 22 U/L (ref 15–41)
Albumin: 3.5 g/dL (ref 3.5–5.0)
Alkaline Phosphatase: 51 U/L (ref 38–126)
Anion gap: 10 (ref 5–15)
BUN: 6 mg/dL (ref 6–20)
CO2: 21 mmol/L — ABNORMAL LOW (ref 22–32)
Calcium: 9.2 mg/dL (ref 8.9–10.3)
Chloride: 100 mmol/L (ref 98–111)
Creatinine, Ser: 0.56 mg/dL (ref 0.44–1.00)
GFR, Estimated: >60 mL/min (ref >60)
Glucose, Bld: 79 mg/dL (ref 70–99)
Potassium: 3.8 mmol/L (ref 3.5–5.1)
Sodium: 131 mmol/L — ABNORMAL LOW (ref 135–145)
Total Bilirubin: 0.8 mg/dL (ref 0.3–1.2)
Total Protein: 7.5 g/dL (ref 6.5–8.1)

## 2022-10-05 LAB — LIPASE, BLOOD: Lipase: 19 U/L (ref 11–51)

## 2022-10-05 MED ORDER — ONDANSETRON HCL 4 MG/2ML IJ SOLN
4.0000 mg | Freq: Once | INTRAMUSCULAR | Status: AC
  Administered 2022-10-05: 4 mg via INTRAVENOUS
  Filled 2022-10-05: qty 2

## 2022-10-05 MED ORDER — ONDANSETRON HCL 4 MG PO TABS: 4.0000 mg | ORAL_TABLET | Freq: Four times a day (QID) | ORAL | 0 refills | Status: DC

## 2022-10-05 MED ORDER — CEPHALEXIN 500 MG PO CAPS: 500.0000 mg | ORAL_CAPSULE | Freq: Four times a day (QID) | ORAL | 0 refills | Status: DC

## 2022-10-05 MED ORDER — ACETAMINOPHEN 325 MG PO TABS
650.0000 mg | ORAL_TABLET | Freq: Once | ORAL | Status: AC
  Administered 2022-10-05: 650 mg via ORAL
  Filled 2022-10-05: qty 2

## 2022-10-05 MED ORDER — SODIUM CHLORIDE 0.9 % IV SOLN
1.0000 g | Freq: Once | INTRAVENOUS | Status: AC
  Administered 2022-10-05: 1 g via INTRAVENOUS
  Filled 2022-10-05: qty 10

## 2022-10-05 NOTE — Discharge Instructions (Signed)
Your workup this evening shows that you have a kidney infection.  Is important that you take the antibiotic as directed until finished.  Drink plenty of water.  You may take Tylenol every 4 hours if needed for fever or pain.  Follow-up with your OB/GYN for recheck.  Return to emergency department for any new or worsening symptoms.

## 2022-10-05 NOTE — ED Provider Notes (Signed)
[place] EMERGENCY DEPARTMENT AT [facility] Provider Note   CSN: [id number] Arrival date & time: 10/05/22  1804     History  Chief Complaint  Patient presents with   Abdominal Pain    [place] JULIEANN HERKO is a 22 y.o. female.   Abdominal Pain Associated symptoms: nausea and vomiting   Associated symptoms: no chest pain, no chills, no cough, no diarrhea, no dysuria, no fever, no hematuria, no vaginal bleeding and no vaginal discharge        [place] MAC DARRIN is a 22 y.o. female G2 P1 who states she is [redacted] weeks pregnant presents to the Emergency Department complaining of left flank pain and foul-smelling urine.  Symptoms began 3 days ago.  She describes sharp stabbing type pain to her flank area that worsens with movement.  States she has a dull pain that is constant to the area.  Pain is nonradiating.  She noticed foul-smelling urine 7 days ago.  States her urine smells like ammonia.  Intermittent nausea and vomiting for several days.  She denies any abdominal pain, pelvic pain or vaginal bleeding.  She has had previous prenatal care and confirmatory ultrasound.  She denies fever, chills, diarrhea.  Home Medications Prior to Admission medications   Medication Sig Start Date End Date Taking? Authorizing Provider  Blood Pressure Monitor MISC For regular home bp monitoring during pregnancy Patient not taking: Reported on 07/18/2022 06/12/19   Cheral Marker, CNM  Prenatal Vit-Fe Fumarate-FA (PRENATAL VITAMINS) 28-0.8 MG TABS Take by mouth.    [provider]      Allergies    Patient has no known allergies.    Review of Systems   Review of Systems  Constitutional:  Negative for appetite change, chills and fever.  Respiratory:  Negative for cough.   Cardiovascular:  Negative for chest pain.  Gastrointestinal:  Positive for abdominal pain, nausea and vomiting. Negative for diarrhea.  Genitourinary:  Positive for flank pain. Negative for difficulty urinating,  dysuria, hematuria, vaginal bleeding and vaginal discharge.       Foul-smelling urine  Musculoskeletal:  Negative for back pain.  Neurological:  Negative for dizziness, weakness, numbness and headaches.    Physical Exam Updated Vital Signs BP 107/68 (BP Location: Right Arm)   Pulse (!) 107   Temp 99.6 F (37.6 C) (Oral)   Resp 16   Ht 4\' 11"  (1.499 m)   Wt 68 kg   LMP 07/12/2022   SpO2 100%   BMI 30.30 kg/m  Physical Exam Vitals and nursing note reviewed.  Constitutional:      General: She is not in acute distress.    Appearance: She is well-developed. She is not ill-appearing or toxic-appearing.  HENT:     Mouth/Throat:     Mouth: Mucous membranes are moist.  Cardiovascular:     Rate and Rhythm: Normal rate and regular rhythm.     Pulses: Normal pulses.  Pulmonary:     Effort: Pulmonary effort is normal. No respiratory distress.  Abdominal:     General: There is no distension.     Palpations: Abdomen is soft.     Tenderness: There is no abdominal tenderness. There is left CVA tenderness. There is no right CVA tenderness.  Musculoskeletal:        General: Normal range of motion.     Right lower leg: No edema.     Left lower leg: No edema.  Skin:    General: Skin is warm.  Capillary Refill: Capillary refill takes less than 2 seconds.  Neurological:     General: No focal deficit present.     Mental Status: She is alert.     Sensory: No sensory deficit.     Motor: No weakness.     ED Results / Procedures / Treatments   Labs (all labs ordered are listed, but only abnormal results are displayed)                  All other components within normal limits  CBC WITH DIFFERENTIAL/PLATELET - Abnormal; Notable for the following components:   WBC 11.7 (*)    Hemoglobin 11.2 (*)    HCT 33.5 (*)    Neutro Abs 9.9 (*)    All other components within normal limits  COMPREHENSIVE METABOLIC PANEL - Abnormal; Notable for the following components:   Sodium 131 (*)     CO2 21 (*)    All other components within normal limits  URINALYSIS, ROUTINE W REFLEX MICROSCOPIC - Abnormal; Notable for the following components:   APPearance HAZY (*)    Ketones, ur 20 (*)    Nitrite POSITIVE (*)    Leukocytes,Ua MODERATE (*)    Bacteria, UA MANY (*)    All other components within normal limits  LIPASE, BLOOD    EKG None  Radiology No results found.  Procedures Procedures    Medications Ordered in ED Medications - No data to display  ED Course/ Medical Decision Making/ A&P                                 Medical Decision Making Patient [redacted] weeks pregnant, here for left flank pain and foul-smelling urine.  Symptoms present for 3 days.  She has been seen by OB/GYN and had ultrasound 1 month ago that confirmed IUP  On exam, patient well-appearing nontoxic.  Has some left-sided CVA tenderness on exam denies abdominal pain, pelvic pain or vaginal bleeding.  Foul-smelling urine that appears cloudy.  No history of kidney stones.  Clinically I suspect this is related to UTI versus pyelonephritis.  Patient is nontoxic-appearing.  Kidney stones also considered.  Amount and/or Complexity of Data Reviewed Labs: ordered.    Details: Labs interpreted by me, show mild leukocytosis, chemistries without significant derangement, unremarkable lipase, urinalysis shows nitrite positive urine with moderate leukocytes many bacteria and 21-50 white cells.  Urine cultures pending Discussion of management or test interpretation with external provider(s): Patient is well-appearing.  Nontoxic.  No vomiting here.  She has tolerated oral fluid challenge.  Given IV Rocephin here as I suspect her symptoms are related to pyelonephritis.  Her urine culture is pending, appears appropriate for discharge home as upcoming OB/GYN appointment.  Will start her on cephalexin and recommended close outpatient follow-up.  Patient was given strict return precautions and she is agreeable to  plan.  Risk OTC drugs. Prescription drug management.           Final Clinical Impression(s) / ED Diagnoses Final diagnoses:  Pyelonephritis    Rx / DC Orders ED Discharge Orders     None         Pauline Aus, PA-C 10/07/22 0924    Eber Hong, MD 10/07/22 [phone]

## 2022-10-05 NOTE — ED Triage Notes (Signed)
Pt presents with c/o left side abdominal and flank pain and strong urine smell x 3 days. Pt states she is [redacted] weeks pregnant.

## 2022-10-08 LAB — URINE CULTURE: Culture: >=100000 — AB

## 2022-10-09 ENCOUNTER — Telehealth (HOSPITAL_BASED_OUTPATIENT_CLINIC_OR_DEPARTMENT_OTHER): Payer: Self-pay | Admitting: *Deleted

## 2022-10-09 NOTE — Telephone Encounter (Signed)
Post ED Visit - Positive Culture Follow-up  Culture report reviewed by antimicrobial stewardship pharmacist: [facility] [x]  Daylene Posey, Pharm.D. []  Celedonio Miyamoto, [address].D., BCPS AQ-ID []  Garvin Fila, Pharm.D., BCPS []  Georgina Pillion, Pharm.D., BCPS []  [place], [address].D., BCPS, AAHIVP []  Estella Husk, Pharm.D., BCPS, AAHIVP []  Lysle Pearl, PharmD, BCPS []  Phillips Climes, PharmD, BCPS []  Agapito Games, PharmD, BCPS []  Verlan Friends, PharmD []  Mervyn Gay, PharmD, BCPS []  Vinnie Level, PharmD  [facility] []  Len Childs, PharmD []  Greer Pickerel, PharmD []  Adalberto Cole, PharmD []  Perlie Gold, Rph []  Lonell Face) Jean Rosenthal, PharmD []  Earl Many, PharmD []  Junita Push, PharmD []  Dorna Leitz, PharmD []  Terrilee Files, PharmD []  Lynann Beaver, PharmD []  Keturah Barre, PharmD []  Loralee Pacas, PharmD []  Bernadene Person, PharmD   Positive urine culture Treated with Cephalexin, organism sensitive to the same and no further patient follow-up is required at this time.  Virl Axe [facility] 10/09/2022, 10:37 AM

## 2022-10-11 ENCOUNTER — Other Ambulatory Visit: Payer: Self-pay | Admitting: Obstetrics & Gynecology

## 2022-10-11 ENCOUNTER — Encounter: Payer: Self-pay | Admitting: Advanced Practice Midwife

## 2022-10-11 DIAGNOSIS — Z3682 Encounter for antenatal screening for nuchal translucency: Secondary | ICD-10-CM

## 2022-10-11 DIAGNOSIS — Z3490 Encounter for supervision of normal pregnancy, unspecified, unspecified trimester: Secondary | ICD-10-CM | POA: Insufficient documentation

## 2022-10-12 ENCOUNTER — Encounter: Payer: Self-pay | Admitting: Advanced Practice Midwife

## 2022-10-12 ENCOUNTER — Ambulatory Visit (INDEPENDENT_AMBULATORY_CARE_PROVIDER_SITE_OTHER): Payer: Medicaid Other | Admitting: Advanced Practice Midwife

## 2022-10-12 ENCOUNTER — Encounter: Payer: Medicaid Other | Admitting: *Deleted

## 2022-10-12 ENCOUNTER — Ambulatory Visit (INDEPENDENT_AMBULATORY_CARE_PROVIDER_SITE_OTHER): Payer: Medicaid Other

## 2022-10-12 VITALS — BP 119/77 | HR 77 | Wt 151.0 lb

## 2022-10-12 DIAGNOSIS — Z3682 Encounter for antenatal screening for nuchal translucency: Secondary | ICD-10-CM

## 2022-10-12 DIAGNOSIS — O2300 Infections of kidney in pregnancy, unspecified trimester: Secondary | ICD-10-CM | POA: Insufficient documentation

## 2022-10-12 DIAGNOSIS — Z124 Encounter for screening for malignant neoplasm of cervix: Secondary | ICD-10-CM

## 2022-10-12 DIAGNOSIS — Z3480 Encounter for supervision of other normal pregnancy, unspecified trimester: Secondary | ICD-10-CM

## 2022-10-12 DIAGNOSIS — O2301 Infections of kidney in pregnancy, first trimester: Secondary | ICD-10-CM | POA: Diagnosis not present

## 2022-10-12 DIAGNOSIS — Z3A13 13 weeks gestation of pregnancy: Secondary | ICD-10-CM

## 2022-10-12 DIAGNOSIS — Z363 Encounter for antenatal screening for malformations: Secondary | ICD-10-CM | POA: Diagnosis not present

## 2022-10-12 DIAGNOSIS — Z3481 Encounter for supervision of other normal pregnancy, first trimester: Secondary | ICD-10-CM | POA: Diagnosis not present

## 2022-10-12 DIAGNOSIS — Z113 Encounter for screening for infections with a predominantly sexual mode of transmission: Secondary | ICD-10-CM

## 2022-10-12 MED ORDER — BLOOD PRESSURE MONITOR MISC: 0 refills | Status: AC

## 2022-10-12 NOTE — Progress Notes (Signed)
[place] 13+1 wks,measurements c/w dates,FHR 158 bpm,normal ovaries,posterior placenta fundal,NB present,NT 2 mm

## 2022-10-12 NOTE — Progress Notes (Signed)
INITIAL OBSTETRICAL VISIT Patient name: Rose Arnold MRN [id number]  Date of birth: 09-15-2000 Chief Complaint:   Initial Prenatal Visit  History of Present Illness:   Rose Arnold is a 22 y.o. G32P1001 African-American female at [redacted]w[redacted]d by LMP c/w u/s at 8.3 weeks with an Estimated Date of Delivery: 04/18/23 being seen today for her initial obstetrical visit.   Patient's last menstrual period was 07/12/2022. Her obstetrical history is significant for  term SVD 2021 .   Today she reports no complaints; having some streaky pink d/c at times; taking Keflex for pyelo dx 8/21 but not having symptoms.  Last pap June 2024. Results were: NILM w/ HRHPV negative     10/12/2022    2:30 PM 04/05/2021   10:53 AM 11/15/2019   10:40 AM 06/12/2019   11:46 AM  Depression screen PHQ 2/9  Decreased Interest 0 0 0 0  Down, Depressed, Hopeless 0 0 0 0  PHQ - 2 Score 0 0 0 0  Altered sleeping 0 0 0 1  Tired, decreased energy 0 0 0 1  Change in appetite 0 0 0 1  Feeling bad or failure about yourself  0 0 0 0  Trouble concentrating 0 0 0 0  Moving slowly or fidgety/restless 0 0 0 0  Suicidal thoughts 0 0 0 0  PHQ-9 Score 0 0 0 3  Difficult doing work/chores   Not difficult at all Not difficult at all        10/12/2022    2:30 PM 04/05/2021   10:53 AM 11/15/2019   10:41 AM 06/12/2019   11:46 AM  GAD 7 : Generalized Anxiety Score  Nervous, Anxious, on Edge 0 0 0 0  Control/stop worrying 0 0 0 0  Worry too much - different things 0 0 0 1  Trouble relaxing 0 0 0 0  Restless 0 0 0 0  Easily annoyed or irritable 0 0 0 0  Afraid - awful might happen 0 0 0 0  Total GAD 7 Score 0 0 0 1  Anxiety Difficulty   Not difficult at all Not difficult at all     Review of Systems:   Pertinent items are noted in HPI Denies cramping/contractions, leakage of fluid, vaginal bleeding, abnormal vaginal discharge w/ itching/odor/irritation, headaches, visual changes, shortness of breath, chest pain, abdominal pain,  severe nausea/vomiting, or problems with urination or bowel movements unless otherwise stated above.  Pertinent History Reviewed:  Reviewed past medical,surgical, social, obstetrical and family history.  Reviewed problem list, medications and allergies. OB History  Gravida Para Term Preterm AB Living  2 1 1  0 0 1  SAB IAB Ectopic Multiple Live Births  0 0 0 0 1    # Outcome Date GA Lbr Len/2nd Weight Sex Type Anes PTL Lv  2 Current           1 Term 12/22/19 [redacted]w[redacted]d 06:18 / 00:45 7 lb 7.9 oz (3.399 kg) F Vag-Spont EPI N LIV   Physical Assessment:   Vitals:   10/12/22 1427  Weight: 151 lb (68.5 kg)  Body mass index is 30.5 kg/m.       Physical Examination:  General appearance - well appearing, and in no distress  Mental status - alert, oriented to person, place, and time  Psych:  She has a normal mood and affect  Skin - warm and dry, normal color, no suspicious lesions noted  Chest - effort normal, all lung fields clear to auscultation  bilaterally  Heart - normal rate and regular rhythm  Abdomen - soft, nontender  Extremities:  No swelling or varicosities noted  Pelvic - not examined  Thin prep pap is not done    TODAY'S NT [place] 13+1 wks,measurements c/w dates,FHR 158 bpm,normal ovaries,posterior placenta fundal,NB present,NT 2 mm   No results found for this or any previous visit (from the past 24 hour(s)).  Assessment & Plan:  1) Low-Risk Pregnancy G2P1001 at [redacted]w[redacted]d with an Estimated Date of Delivery: 04/18/23   2) Initial OB visit  3) Currently treating pyelo v UTI, will get TOC at next visit  4) Asthma, rare need for inhaler  Meds:  Meds ordered this encounter  Medications   Blood Pressure Monitor MISC    Sig: For regular home bp monitoring during pregnancy    Dispense:  1 each    Refill:  0    Z34.81 Please mail to patient    Initial labs obtained Continue prenatal vitamins Reviewed n/v relief measures and warning s/s to report Reviewed recommended weight gain  based on pre-gravid BMI Encouraged well-balanced diet Genetic & carrier screening discussed: requests Panorama and NT/IT, declines Horizon  (neg 2021) Ultrasound discussed; fetal survey: requested CCNC completed> form faxed if has or is planning to apply for medicaid The nature of  - [facility] [employer] with multiple MDs and other Advanced Practice Providers was explained to patient; also emphasized that fellows, residents, and students are part of our team. Does not have home bp cuff. Office bp cuff given: no. Rx sent: yes. Check bp weekly, let us know if consistently >140/90.   No indications for ASA therapy or HgbA1c (per uptodate)  Follow-up: Return for 4wk LROB & 2nd IT; 7wk LROB & anatomy u/s.   Orders Placed This Encounter  Procedures   US OB Comp + 14 Wk   Integrated 1   PANORAMA PRENATAL TEST   CBC/D/Plt+RPR+Rh+ABO+RubIgG...    Arabella Merles CNM 10/12/2022 2:44 PM

## 2022-10-12 NOTE — Patient Instructions (Signed)
[place], thank you for choosing our office today! We appreciate the opportunity to meet your healthcare needs. You may receive a short survey by mail, e-mail, or through [employer]. If you are happy with your care we would appreciate if you could take just a few minutes to complete the survey questions. We read all of your comments and take your feedback very seriously. Thank you again for choosing our office.  [facility] [employer] [facility] Team at [facility]  [facility] at [facility] ([address] [place], [place] [address]) Entrance C, located off of [address] Free 24/7 valet parking   Nausea & Vomiting Have saltine crackers or pretzels by your bed and eat a few bites before you raise your head out of bed in the morning Eat small frequent meals throughout the day instead of large meals Drink plenty of fluids throughout the day to stay hydrated, just don't drink a lot of fluids with your meals.  This can make your stomach fill up faster making you feel sick Do not brush your teeth right after you eat Products with real ginger are good for nausea, like ginger ale and ginger hard candy Make sure it says made with real ginger! Sucking on sour candy like lemon heads is also good for nausea If your prenatal vitamins make you nauseated, take them at night so you will sleep through the nausea Sea Bands If you feel like you need medicine for the nausea & vomiting please let us know If you are unable to keep any fluids or food down please let us know   Constipation Drink plenty of fluid, preferably water, throughout the day Eat foods high in fiber such as fruits, vegetables, and grains Exercise, such as walking, is a good way to keep your bowels regular Drink warm fluids, especially warm prune juice, or decaf coffee Eat a 1/2 cup of real oatmeal (not instant), 1/2 cup applesauce, and 1/2-1 cup warm prune juice every day If needed, you may take Colace (docusate sodium) stool softener  once or twice a day to help keep the stool soft.  If you still are having problems with constipation, you may take Miralax once daily as needed to help keep your bowels regular.   Home Blood Pressure Monitoring for Patients   Your provider has recommended that you check your blood pressure (BP) at least once a week at home. If you do not have a blood pressure cuff at home, one will be provided for you. Contact your provider if you have not received your monitor within 1 week.   Helpful Tips for Accurate Home Blood Pressure Checks  Don't smoke, exercise, or drink caffeine 30 minutes before checking your BP Use the restroom before checking your BP (a full bladder can raise your pressure) Relax in a comfortable upright chair Feet on the ground Left arm resting comfortably on a flat surface at the level of your heart Legs uncrossed Back supported Sit quietly and don't talk Place the cuff on your bare arm Adjust snuggly, so that only two fingertips can fit between your skin and the top of the cuff Check 2 readings separated by at least one minute Keep a log of your BP readings For a visual, please reference this diagram: http://ccnc.care/bpdiagram  Provider Name: [facility]     Phone: [phone]  Zone 1: ALL CLEAR  Continue to monitor your symptoms:  BP reading is less than 140 (top number) or less than 90 (bottom  number)  No right upper stomach pain No headaches or seeing spots No feeling nauseated or throwing up No swelling in face and hands  Zone 2: CAUTION Call your doctor's office for any of the following:  BP reading is greater than 140 (top number) or greater than 90 (bottom number)  Stomach pain under your ribs in the middle or right side Headaches or seeing spots Feeling nauseated or throwing up Swelling in face and hands  Zone 3: EMERGENCY  Seek immediate medical care if you have any of the following:  BP reading is greater than160 (top number) or greater than  110 (bottom number) Severe headaches not improving with Tylenol Serious difficulty catching your breath Any worsening symptoms from Zone 2    First Trimester of Pregnancy The first trimester of pregnancy is from week 1 until the end of week 12 (months 1 through 3). A week after a sperm fertilizes an egg, the egg will implant on the wall of the uterus. This embryo will begin to develop into a baby. Genes from you and your partner are forming the baby. The female genes determine whether the baby is a boy or a girl. At 6-8 weeks, the eyes and face are formed, and the heartbeat can be seen on ultrasound. At the end of 12 weeks, all the baby's organs are formed.  Now that you are pregnant, you will want to do everything you can to have a healthy baby. Two of the most important things are to get good prenatal care and to follow your health care provider's instructions. Prenatal care is all the medical care you receive before the baby's birth. This care will help prevent, find, and treat any problems during the pregnancy and childbirth. BODY CHANGES Your body goes through many changes during pregnancy. The changes vary from woman to woman.  You may gain or lose a couple of pounds at first. You may feel sick to your stomach (nauseous) and throw up (vomit). If the vomiting is uncontrollable, call your health care provider. You may tire easily. You may develop headaches that can be relieved by medicines approved by your health care provider. You may urinate more often. Painful urination may mean you have a bladder infection. You may develop heartburn as a result of your pregnancy. You may develop constipation because certain hormones are causing the muscles that push waste through your intestines to slow down. You may develop hemorrhoids or swollen, bulging veins (varicose veins). Your breasts may begin to grow larger and become tender. Your nipples may stick out more, and the tissue that surrounds them  (areola) may become darker. Your gums may bleed and may be sensitive to brushing and flossing. Dark spots or blotches (chloasma, mask of pregnancy) may develop on your face. This will likely fade after the baby is born. Your menstrual periods will stop. You may have a loss of appetite. You may develop cravings for certain kinds of food. You may have changes in your emotions from day to day, such as being excited to be pregnant or being concerned that something may go wrong with the pregnancy and baby. You may have more vivid and strange dreams. You may have changes in your hair. These can include thickening of your hair, rapid growth, and changes in texture. Some women also have hair loss during or after pregnancy, or hair that feels dry or thin. Your hair will most likely return to normal after your baby is born. WHAT TO EXPECT AT YOUR PRENATAL  VISITS During a routine prenatal visit: You will be weighed to make sure you and the baby are growing normally. Your blood pressure will be taken. Your abdomen will be measured to track your baby's growth. The fetal heartbeat will be listened to starting around week 10 or 12 of your pregnancy. Test results from any previous visits will be discussed. Your health care provider may ask you: How you are feeling. If you are feeling the baby move. If you have had any abnormal symptoms, such as leaking fluid, bleeding, severe headaches, or abdominal cramping. If you have any questions. Other tests that may be performed during your first trimester include: Blood tests to find your blood type and to check for the presence of any previous infections. They will also be used to check for low iron levels (anemia) and Rh antibodies. Later in the pregnancy, blood tests for diabetes will be done along with other tests if problems develop. Urine tests to check for infections, diabetes, or protein in the urine. An ultrasound to confirm the proper growth and development  of the baby. An amniocentesis to check for possible genetic problems. Fetal screens for spina bifida and Down syndrome. You may need other tests to make sure you and the baby are doing well. HOME CARE INSTRUCTIONS  Medicines Follow your health care provider's instructions regarding medicine use. Specific medicines may be either safe or unsafe to take during pregnancy. Take your prenatal vitamins as directed. If you develop constipation, try taking a stool softener if your health care provider approves. Diet Eat regular, well-balanced meals. Choose a variety of foods, such as meat or vegetable-based protein, fish, milk and low-fat dairy products, vegetables, fruits, and whole grain breads and cereals. Your health care provider will help you determine the amount of weight gain that is right for you. Avoid raw meat and uncooked cheese. These carry germs that can cause birth defects in the baby. Eating four or five small meals rather than three large meals a day may help relieve nausea and vomiting. If you start to feel nauseous, eating a few soda crackers can be helpful. Drinking liquids between meals instead of during meals also seems to help nausea and vomiting. If you develop constipation, eat more high-fiber foods, such as fresh vegetables or fruit and whole grains. Drink enough fluids to keep your urine clear or pale yellow. Activity and Exercise Exercise only as directed by your health care provider. Exercising will help you: Control your weight. Stay in shape. Be prepared for labor and delivery. Experiencing pain or cramping in the lower abdomen or low back is a good sign that you should stop exercising. Check with your health care provider before continuing normal exercises. Try to avoid standing for long periods of time. Move your legs often if you must stand in one place for a long time. Avoid heavy lifting. Wear low-heeled shoes, and practice good posture. You may continue to have sex  unless your health care provider directs you otherwise. Relief of Pain or Discomfort Wear a good support bra for breast tenderness.   Take warm sitz baths to soothe any pain or discomfort caused by hemorrhoids. Use hemorrhoid cream if your health care provider approves.   Rest with your legs elevated if you have leg cramps or low back pain. If you develop varicose veins in your legs, wear support hose. Elevate your feet for 15 minutes, 3-4 times a day. Limit salt in your diet. Prenatal Care Schedule your prenatal visits by the  twelfth week of pregnancy. They are usually scheduled monthly at first, then more often in the last 2 months before delivery. Write down your questions. Take them to your prenatal visits. Keep all your prenatal visits as directed by your health care provider. Safety Wear your seat belt at all times when driving. Make a list of emergency phone numbers, including numbers for family, friends, the hospital, and police and fire departments. General Tips Ask your health care provider for a referral to a local prenatal education class. Begin classes no later than at the beginning of month 6 of your pregnancy. Ask for help if you have counseling or nutritional needs during pregnancy. Your health care provider can offer advice or refer you to specialists for help with various needs. Do not use hot tubs, steam rooms, or saunas. Do not douche or use tampons or scented sanitary pads. Do not cross your legs for long periods of time. Avoid cat litter boxes and soil used by cats. These carry germs that can cause birth defects in the baby and possibly loss of the fetus by miscarriage or stillbirth. Avoid all smoking, herbs, alcohol, and medicines not prescribed by your health care provider. Chemicals in these affect the formation and growth of the baby. Schedule a dentist appointment. At home, brush your teeth with a soft toothbrush and be gentle when you floss. SEEK MEDICAL CARE IF:   You have dizziness. You have mild pelvic cramps, pelvic pressure, or nagging pain in the abdominal area. You have persistent nausea, vomiting, or diarrhea. You have a bad smelling vaginal discharge. You have pain with urination. You notice increased swelling in your face, hands, legs, or ankles. SEEK IMMEDIATE MEDICAL CARE IF:  You have a fever. You are leaking fluid from your vagina. You have spotting or bleeding from your vagina. You have severe abdominal cramping or pain. You have rapid weight gain or loss. You vomit blood or material that looks like coffee grounds. You are exposed to [place] measles and have never had them. You are exposed to fifth disease or chickenpox. You develop a severe headache. You have shortness of breath. You have any kind of trauma, such as from a fall or a car accident. Document Released: 01/25/2001 Document Revised: 06/17/2013 Document Reviewed: 12/11/2012 [facility] Patient Information 2015 [place], [place]. This information is not intended to replace advice given to you by your health care provider. Make sure you discuss any questions you have with your health care provider.

## 2022-10-14 ENCOUNTER — Other Ambulatory Visit: Payer: Self-pay | Admitting: Advanced Practice Midwife

## 2022-10-14 DIAGNOSIS — O99011 Anemia complicating pregnancy, first trimester: Secondary | ICD-10-CM

## 2022-10-14 LAB — HCV INTERPRETATION

## 2022-10-14 LAB — CBC/D/PLT+RPR+RH+ABO+RUBIGG...
Antibody Screen: NEGATIVE
Basophils Absolute: 0 10*3/uL (ref 0.0–0.2)
Basos: 0 %
EOS (ABSOLUTE): 0.2 10*3/uL (ref 0.0–0.4)
Eos: 3 %
HCV Ab: NONREACTIVE
HIV Screen 4th Generation wRfx: NONREACTIVE
Hematocrit: 32 % — ABNORMAL LOW (ref 34.0–46.6)
Hemoglobin: 10.5 g/dL — ABNORMAL LOW (ref 11.1–15.9)
Hepatitis B Surface Ag: NEGATIVE
Immature Grans (Abs): 0 10*3/uL (ref 0.0–0.1)
Immature Granulocytes: 0 %
Lymphocytes Absolute: 1.6 10*3/uL (ref 0.7–3.1)
Lymphs: 21 %
MCH: 27.9 pg (ref 26.6–33.0)
MCHC: 32.8 g/dL (ref 31.5–35.7)
MCV: 85 fL (ref 79–97)
Monocytes Absolute: 0.5 10*3/uL (ref 0.1–0.9)
Monocytes: 7 %
Neutrophils Absolute: 5.4 10*3/uL (ref 1.4–7.0)
Neutrophils: 69 %
Platelets: 269 10*3/uL (ref 150–450)
RBC: 3.76 x10E6/uL — ABNORMAL LOW (ref 3.77–5.28)
RDW: 13.2 % (ref 11.7–15.4)
RPR Ser Ql: NONREACTIVE
Rh Factor: POSITIVE
Rubella Antibodies, IGG: 1.05 {index} (ref >0.99)
WBC: 7.7 10*3/uL (ref 3.4–10.8)

## 2022-10-14 LAB — INTEGRATED 1
Crown Rump Length: 73.9 mm
Gest. Age on Collection Date: 13.3 wk
Maternal Age at EDD: 23.1 a
Nuchal Translucency (NT): 2 mm
Number of Fetuses: 1
PAPP-A Value: 1947.8 ng/mL
Weight: 151 [lb_av]

## 2022-10-14 MED ORDER — FERROUS SULFATE 325 (65 FE) MG PO TBEC: 325.0000 mg | DELAYED_RELEASE_TABLET | ORAL | 3 refills | Status: DC

## 2022-10-18 ENCOUNTER — Ambulatory Visit: Payer: Medicaid Other | Admitting: Adult Health

## 2022-10-26 LAB — PANORAMA PRENATAL TEST FULL PANEL:PANORAMA TEST PLUS 5 ADDITIONAL MICRODELETIONS: FETAL FRACTION: 7.2

## 2022-11-09 ENCOUNTER — Encounter: Payer: Self-pay | Admitting: Women's Health

## 2022-11-09 ENCOUNTER — Ambulatory Visit (INDEPENDENT_AMBULATORY_CARE_PROVIDER_SITE_OTHER): Payer: Medicaid Other | Admitting: Women's Health

## 2022-11-09 VITALS — BP 106/72 | HR 87 | Wt 155.0 lb

## 2022-11-09 DIAGNOSIS — Z363 Encounter for antenatal screening for malformations: Secondary | ICD-10-CM

## 2022-11-09 DIAGNOSIS — Z87440 Personal history of urinary (tract) infections: Secondary | ICD-10-CM

## 2022-11-09 DIAGNOSIS — Z3A17 17 weeks gestation of pregnancy: Secondary | ICD-10-CM

## 2022-11-09 DIAGNOSIS — Z3482 Encounter for supervision of other normal pregnancy, second trimester: Secondary | ICD-10-CM

## 2022-11-09 DIAGNOSIS — Z3480 Encounter for supervision of other normal pregnancy, unspecified trimester: Secondary | ICD-10-CM

## 2022-11-09 DIAGNOSIS — Z1379 Encounter for other screening for genetic and chromosomal anomalies: Secondary | ICD-10-CM | POA: Diagnosis not present

## 2022-11-09 NOTE — Progress Notes (Signed)
LOW-RISK PREGNANCY VISIT Patient name: Rose Arnold MRN [id number]  Date of birth: 07-06-2000 Chief Complaint:   Routine Prenatal Visit  History of Present Illness:   Rose T Backhaus is a 22 y.o. G73P1001 female at [redacted]w[redacted]d with an Estimated Date of Delivery: 04/18/23 being seen today for ongoing management of a low-risk pregnancy.   Today she reports no complaints. Contractions: Not present. Vag. Bleeding: None.   . denies leaking of fluid.     10/12/2022    2:30 PM 04/05/2021   10:53 AM 11/15/2019   10:40 AM 06/12/2019   11:46 AM  Depression screen PHQ 2/9  Decreased Interest 0 0 0 0  Down, Depressed, Hopeless 0 0 0 0  PHQ - 2 Score 0 0 0 0  Altered sleeping 0 0 0 1  Tired, decreased energy 0 0 0 1  Change in appetite 0 0 0 1  Feeling bad or failure about yourself  0 0 0 0  Trouble concentrating 0 0 0 0  Moving slowly or fidgety/restless 0 0 0 0  Suicidal thoughts 0 0 0 0  PHQ-9 Score 0 0 0 3  Difficult doing work/chores   Not difficult at all Not difficult at all        10/12/2022    2:30 PM 04/05/2021   10:53 AM 11/15/2019   10:41 AM 06/12/2019   11:46 AM  GAD 7 : Generalized Anxiety Score  Nervous, Anxious, on Edge 0 0 0 0  Control/stop worrying 0 0 0 0  Worry too much - different things 0 0 0 1  Trouble relaxing 0 0 0 0  Restless 0 0 0 0  Easily annoyed or irritable 0 0 0 0  Afraid - awful might happen 0 0 0 0  Total GAD 7 Score 0 0 0 1  Anxiety Difficulty   Not difficult at all Not difficult at all      Review of Systems:   Pertinent items are noted in HPI Denies abnormal vaginal discharge w/ itching/odor/irritation, headaches, visual changes, shortness of breath, chest pain, abdominal pain, severe nausea/vomiting, or problems with urination or bowel movements unless otherwise stated above. Pertinent History Reviewed:  Reviewed past medical,surgical, social, obstetrical and family history.  Reviewed problem list, medications and allergies. Physical Assessment:    Vitals:   11/09/22 1419  BP: 106/72  Pulse: 87  Weight: 155 lb (70.3 kg)  Body mass index is 31.31 kg/m.        Physical Examination:   General appearance: Well appearing, and in no distress  Mental status: Alert, oriented to person, place, and time  Skin: Warm & dry  Cardiovascular: Normal heart rate noted  Respiratory: Normal respiratory effort, no distress  Abdomen: Soft, gravid, nontender  Pelvic: Cervical exam deferred         Extremities:    Fetal Status: Fetal Heart Rate (bpm): 151        Chaperone: N/A   No results found for this or any previous visit (from the past 24 hour(s)).  Assessment & Plan:  1) Low-risk pregnancy G2P1001 at [redacted]w[redacted]d with an Estimated Date of Delivery: 04/18/23   2) Recent pyelo, urine cx poc today (couldn't void yet, gave water, going to lab and will try again before leaves)   Meds: No orders of the defined types were placed in this encounter.  Labs/procedures today: urine culture and 2nd IT  Plan:  Continue routine obstetrical care  Next visit: prefers will be in person  for u/s     Reviewed: Preterm labor symptoms and general obstetric precautions including but not limited to vaginal bleeding, contractions, leaking of fluid and fetal movement were reviewed in detail with the patient.  All questions were answered. Does have home bp cuff. Office bp cuff given: not applicable. Check bp weekly, let us know if consistently >140 and/or >90.  Follow-up: Return for As scheduled.  Future Appointments  Date Time Provider Department Center  11/30/2022  9:15 AM  [facility] [place] [facility] None  11/30/2022 10:10 AM Sue Lush, FNP [facility]    Orders Placed This Encounter  Procedures   Urine Culture   INTEGRATED 2   Cheral Marker CNM, [facility] 11/09/2022 2:30 PM

## 2022-11-09 NOTE — Patient Instructions (Signed)
[place], thank you for choosing our office today! We appreciate the opportunity to meet your healthcare needs. You may receive a short survey by mail, e-mail, or through [employer]. If you are happy with your care we would appreciate if you could take just a few minutes to complete the survey questions. We read all of your comments and take your feedback very seriously. Thank you again for choosing our office.  Center for [employer] Team at [facility] at [facility] ([address] [place], [place] [address]) Entrance C, located off of [address] Free 24/7 valet parking  Go to [email or web address] to register for FREE online childbirth classes  Call the office [phone]) or go to [facility] if: You begin to severe cramping Your water breaks.  Sometimes it is a big gush of fluid, sometimes it is just a trickle that keeps getting your panties wet or running down your legs You have vaginal bleeding.  It is normal to have a small amount of spotting if your cervix was checked.   [facility] Pediatricians/Family Doctors [facility]): [address] Colette Ribas, [phone]           [facility]: [address], [phone]                [facility]): [address], [phone] (call to ask if accepting patients) [facility] Department: [address], [place], [phone]    [facility] Pediatricians/Family Doctors [facility]): [phone] [address], [phone] [facility]: [address] [place], [phone] [facility] [place]: [address], [phone]  [facility] Doctors  [facility]): [phone] [facility]: [address], [phone]   [facility] Doctors [facility]: [address], [phone]  [facility] Doctors  [facility]: [phone], [phone]  Home Blood Pressure Monitoring for Patients   Your provider has recommended that you check your blood pressure (BP) at least once a week at home. If you do not have a blood pressure cuff at home, one will be provided for you. Contact your provider if you have not received your monitor within 1 week.   Helpful Tips for Accurate Home Blood Pressure Checks  Don't smoke, exercise, or drink caffeine 30 minutes before checking your BP Use the restroom before checking your BP (a full bladder can raise your pressure) Relax in a comfortable upright chair Feet on the ground Left arm resting comfortably on a flat surface at the level of your heart Legs uncrossed Back supported Sit quietly and don't talk Place the cuff on your bare arm Adjust snuggly, so that only two fingertips can fit between your skin and the top of the cuff Check 2 readings separated by at least one minute Keep a log of your BP readings For a visual, please reference this diagram: http://ccnc.care/bpdiagram  Provider Name: [facility]     Phone: [phone]  Zone 1: ALL CLEAR  Continue to monitor your symptoms:  BP reading is less than 140 (top number) or less than 90 (bottom number)  No right upper stomach pain No headaches or seeing spots No feeling nauseated or throwing up No swelling in face and hands  Zone 2: CAUTION Call your doctor's office for any of the following:  BP reading is greater than 140 (top number) or greater than  90 (bottom number)  Stomach pain under your ribs in the middle or right side Headaches or seeing spots Feeling nauseated or throwing up Swelling in face and hands  Zone 3: EMERGENCY  Seek immediate medical care if you have any of the following:  BP reading is greater than160 (top number) or greater than 110 (bottom number) Severe headaches not improving with Tylenol Serious difficulty catching your breath Any worsening symptoms from  Zone 2     Second Trimester of Pregnancy The second trimester is from week 14 through week 27 (months 4 through 6). The second trimester is often a time when you feel your best. Your body has adjusted to being pregnant, and you begin to feel better physically. Usually, morning sickness has lessened or quit completely, you may have more energy, and you may have an increase in appetite. The second trimester is also a time when the fetus is growing rapidly. At the end of the sixth month, the fetus is about 9 inches long and weighs about 1 pounds. You will likely begin to feel the baby move (quickening) between 16 and 20 weeks of pregnancy. Body changes during your second trimester Your body continues to go through many changes during your second trimester. The changes vary from woman to woman. Your weight will continue to increase. You will notice your lower abdomen bulging out. You may begin to get stretch marks on your hips, abdomen, and breasts. You may develop headaches that can be relieved by medicines. The medicines should be approved by your health care provider. You may urinate more often because the fetus is pressing on your bladder. You may develop or continue to have heartburn as a result of your pregnancy. You may develop constipation because certain hormones are causing the muscles that push waste through your intestines to slow down. You may develop hemorrhoids or swollen, bulging veins (varicose veins). You may have back pain. This is caused by: Weight gain. Pregnancy hormones that are relaxing the joints in your pelvis. A shift in weight and the muscles that support your balance. Your breasts will continue to grow and they will continue to become tender. Your gums may bleed and may be sensitive to brushing and flossing. Dark spots or blotches (chloasma, mask of pregnancy) may develop on your face. This will likely fade after the baby is born. A dark line from your belly button to  the pubic area (linea nigra) may appear. This will likely fade after the baby is born. You may have changes in your hair. These can include thickening of your hair, rapid growth, and changes in texture. Some women also have hair loss during or after pregnancy, or hair that feels dry or thin. Your hair will most likely return to normal after your baby is born.  What to expect at prenatal visits During a routine prenatal visit: You will be weighed to make sure you and the fetus are growing normally. Your blood pressure will be taken. Your abdomen will be measured to track your baby's growth. The fetal heartbeat will be listened to. Any test results from the previous visit will be discussed.  Your health care provider may ask you: How you are feeling. If you are feeling the baby move. If you have had any abnormal symptoms, such as leaking fluid, bleeding, severe headaches, or abdominal cramping. If you are using any tobacco products, including cigarettes, chewing tobacco, and electronic cigarettes. If you have any questions.  Other tests that may be performed during  your second trimester include: Blood tests that check for: Low iron levels (anemia). High blood sugar that affects pregnant women (gestational diabetes) between 27 and 28 weeks. Rh antibodies. This is to check for a protein on red blood cells (Rh factor). Urine tests to check for infections, diabetes, or protein in the urine. An ultrasound to confirm the proper growth and development of the baby. An amniocentesis to check for possible genetic problems. Fetal screens for spina bifida and Down syndrome. HIV (human immunodeficiency virus) testing. Routine prenatal testing includes screening for HIV, unless you choose not to have this test.  Follow these instructions at home: Medicines Follow your health care provider's instructions regarding medicine use. Specific medicines may be either safe or unsafe to take during  pregnancy. Take a prenatal vitamin that contains at least 600 micrograms (mcg) of folic acid. If you develop constipation, try taking a stool softener if your health care provider approves. Eating and drinking Eat a balanced diet that includes fresh fruits and vegetables, whole grains, good sources of protein such as meat, eggs, or tofu, and low-fat dairy. Your health care provider will help you determine the amount of weight gain that is right for you. Avoid raw meat and uncooked cheese. These carry germs that can cause birth defects in the baby. If you have low calcium intake from food, talk to your health care provider about whether you should take a daily calcium supplement. Limit foods that are high in fat and processed sugars, such as fried and sweet foods. To prevent constipation: Drink enough fluid to keep your urine clear or pale yellow. Eat foods that are high in fiber, such as fresh fruits and vegetables, whole grains, and beans. Activity Exercise only as directed by your health care provider. Most women can continue their usual exercise routine during pregnancy. Try to exercise for 30 minutes at least 5 days a week. Stop exercising if you experience uterine contractions. Avoid heavy lifting, wear low heel shoes, and practice good posture. A sexual relationship may be continued unless your health care provider directs you otherwise. Relieving pain and discomfort Wear a good support bra to prevent discomfort from breast tenderness. Take warm sitz baths to soothe any pain or discomfort caused by hemorrhoids. Use hemorrhoid cream if your health care provider approves. Rest with your legs elevated if you have leg cramps or low back pain. If you develop varicose veins, wear support hose. Elevate your feet for 15 minutes, 3-4 times a day. Limit salt in your diet. Prenatal Care Write down your questions. Take them to your prenatal visits. Keep all your prenatal visits as told by your health  care provider. This is important. Safety Wear your seat belt at all times when driving. Make a list of emergency phone numbers, including numbers for family, friends, the hospital, and police and fire departments. General instructions Ask your health care provider for a referral to a local prenatal education class. Begin classes no later than the beginning of month 6 of your pregnancy. Ask for help if you have counseling or nutritional needs during pregnancy. Your health care provider can offer advice or refer you to specialists for help with various needs. Do not use hot tubs, steam rooms, or saunas. Do not douche or use tampons or scented sanitary pads. Do not cross your legs for long periods of time. Avoid cat litter boxes and soil used by cats. These carry germs that can cause birth defects in the baby and possibly loss of the  fetus by miscarriage or stillbirth. Avoid all smoking, herbs, alcohol, and unprescribed drugs. Chemicals in these products can affect the formation and growth of the baby. Do not use any products that contain nicotine or tobacco, such as cigarettes and e-cigarettes. If you need help quitting, ask your health care provider. Visit your dentist if you have not gone yet during your pregnancy. Use a soft toothbrush to brush your teeth and be gentle when you floss. Contact a health care provider if: You have dizziness. You have mild pelvic cramps, pelvic pressure, or nagging pain in the abdominal area. You have persistent nausea, vomiting, or diarrhea. You have a bad smelling vaginal discharge. You have pain when you urinate. Get help right away if: You have a fever. You are leaking fluid from your vagina. You have spotting or bleeding from your vagina. You have severe abdominal cramping or pain. You have rapid weight gain or weight loss. You have shortness of breath with chest pain. You notice sudden or extreme swelling of your face, hands, ankles, feet, or legs. You  have not felt your baby move in over an hour. You have severe headaches that do not go away when you take medicine. You have vision changes. Summary The second trimester is from week 14 through week 27 (months 4 through 6). It is also a time when the fetus is growing rapidly. Your body goes through many changes during pregnancy. The changes vary from woman to woman. Avoid all smoking, herbs, alcohol, and unprescribed drugs. These chemicals affect the formation and growth your baby. Do not use any tobacco products, such as cigarettes, chewing tobacco, and e-cigarettes. If you need help quitting, ask your health care provider. Contact your health care provider if you have any questions. Keep all prenatal visits as told by your health care provider. This is important. This information is not intended to replace advice given to you by your health care provider. Make sure you discuss any questions you have with your health care provider. Document Released: 01/25/2001 Document Revised: 07/09/2015 Document Reviewed: 04/03/2012 Elsevier Interactive Patient Education  2017 [employer].

## 2022-11-12 LAB — INTEGRATED 2
AFP MoM: 1.26
Alpha-Fetoprotein: 51 ng/mL
Crown Rump Length: 73.9 mm
DIA MoM: 1.54
DIA Value: 246.1 pg/mL
Estriol, Unconjugated: 1.11 ng/mL
Gest. Age on Collection Date: 13.3 wk
Gestational Age: 17.3 wk
Maternal Age at EDD: 23.1 a
Nuchal Translucency (NT): 2 mm
Nuchal Translucency MoM: 1.18
Number of Fetuses: 1
PAPP-A MoM: 1.52
PAPP-A Value: 1947.8 ng/mL
Test Results:: NEGATIVE
Weight: 151 [lb_av]
Weight: 151 [lb_av]
hCG MoM: 0.96
hCG Value: 27 [IU]/mL
uE3 MoM: 1

## 2022-11-30 ENCOUNTER — Encounter: Payer: Medicaid Other | Admitting: Obstetrics and Gynecology

## 2022-11-30 ENCOUNTER — Other Ambulatory Visit: Payer: Medicaid Other

## 2022-11-30 ENCOUNTER — Ambulatory Visit (INDEPENDENT_AMBULATORY_CARE_PROVIDER_SITE_OTHER): Payer: Medicaid Other | Admitting: Obstetrics and Gynecology

## 2022-11-30 VITALS — BP 115/71 | HR 96 | Wt 159.0 lb

## 2022-11-30 DIAGNOSIS — Z3482 Encounter for supervision of other normal pregnancy, second trimester: Secondary | ICD-10-CM | POA: Diagnosis not present

## 2022-11-30 DIAGNOSIS — Z87440 Personal history of urinary (tract) infections: Secondary | ICD-10-CM | POA: Diagnosis not present

## 2022-11-30 DIAGNOSIS — Z3A20 20 weeks gestation of pregnancy: Secondary | ICD-10-CM

## 2022-11-30 NOTE — Progress Notes (Signed)
   PRENATAL VISIT NOTE  Subjective:  Rose Arnold is a 22 y.o. G2P1001 at [redacted]w[redacted]d being seen today for ongoing prenatal care.  She is currently monitored for the following issues for this low-risk pregnancy and has Asthma, chronic; Anemia in pregnancy; Encounter for supervision of normal pregnancy, antepartum; and Pyelonephritis affecting pregnancy on their problem list.  Patient reports no complaints.  Contractions: Not present. Vag. Bleeding: None.  Movement: Present. Denies leaking of fluid.   The following portions of the patient's history were reviewed and updated as appropriate: allergies, current medications, past family history, past medical history, past social history, past surgical history and problem list.   Objective:   Vitals:   11/30/22 1330  BP: 115/71  Pulse: 96  Weight: 159 lb (72.1 kg)    Fetal Status: Fetal Heart Rate (bpm): 150   Movement: Present     General:  Alert, oriented and cooperative. Patient is in no acute distress.  Skin: Skin is warm and dry. No rash noted.   Cardiovascular: Normal heart rate noted  Respiratory: Normal respiratory effort, no problems with respiration noted  Abdomen: Soft, gravid, appropriate for gestational age.  Pain/Pressure: Absent     Pelvic: Cervical exam deferred        Extremities: Normal range of motion.     Mental Status: Normal mood and affect. Normal behavior. Normal judgment and thought content.   Assessment and Plan:  Pregnancy: G2P1001 at [redacted]w[redacted]d 1. Encounter for supervision of other normal pregnancy in second trimester BP and FHR normal Feeling regular fetal movememt  2. History of UTI Urine culture today   3. [redacted] weeks gestation of pregnancy Anatomy scan tomorrow  Preterm labor symptoms and general obstetric precautions including but not limited to vaginal bleeding, contractions, leaking of fluid and fetal movement were reviewed in detail with the patient. Please refer to After Visit Summary for other counseling  recommendations.   Return in about 4 weeks (around 12/28/2022) for OB VISIT (MD or APP).  Future Appointments  Date Time Provider Department Center  12/01/2022 11:30 AM [facility] [place] [facility] None    Albertine Grates, [place]

## 2022-12-01 ENCOUNTER — Other Ambulatory Visit: Payer: Medicaid Other

## 2022-12-01 DIAGNOSIS — Z3A20 20 weeks gestation of pregnancy: Secondary | ICD-10-CM | POA: Diagnosis not present

## 2022-12-01 DIAGNOSIS — O2302 Infections of kidney in pregnancy, second trimester: Secondary | ICD-10-CM

## 2022-12-01 DIAGNOSIS — Z363 Encounter for antenatal screening for malformations: Secondary | ICD-10-CM

## 2022-12-01 DIAGNOSIS — Z3480 Encounter for supervision of other normal pregnancy, unspecified trimester: Secondary | ICD-10-CM

## 2022-12-01 NOTE — Progress Notes (Signed)
[place] 20+2 wks,cephalic,cx 3.7 cm,posterior fundal placenta gr 0,normal ovaries,SVP of fluid 5.9 cm,FHR 138 bpm,EFW 377 g 72%,anatomy complete,no obvious abnormalities

## 2022-12-03 LAB — URINE CULTURE

## 2022-12-28 ENCOUNTER — Ambulatory Visit (INDEPENDENT_AMBULATORY_CARE_PROVIDER_SITE_OTHER): Payer: Medicaid Other | Admitting: Obstetrics & Gynecology

## 2022-12-28 ENCOUNTER — Encounter: Payer: Self-pay | Admitting: Obstetrics & Gynecology

## 2022-12-28 VITALS — BP 102/69 | HR 94 | Wt 157.4 lb

## 2022-12-28 DIAGNOSIS — Z3A24 24 weeks gestation of pregnancy: Secondary | ICD-10-CM

## 2022-12-28 DIAGNOSIS — Z87448 Personal history of other diseases of urinary system: Secondary | ICD-10-CM

## 2022-12-28 DIAGNOSIS — Z3480 Encounter for supervision of other normal pregnancy, unspecified trimester: Secondary | ICD-10-CM

## 2022-12-28 DIAGNOSIS — Z3482 Encounter for supervision of other normal pregnancy, second trimester: Secondary | ICD-10-CM

## 2022-12-28 NOTE — Progress Notes (Signed)
LOW-RISK PREGNANCY VISIT Patient name: Rose Arnold MRN [id number]  Date of birth: 09-28-2000 Chief Complaint:   Routine Prenatal Visit  History of Present Illness:   Rose Arnold is a 22 y.o. G23P1001 female at [redacted]w[redacted]d with an Estimated Date of Delivery: 04/18/23 being seen today for ongoing management of a low-risk pregnancy.   -History of pyelonephritis-she is not currently on suppression therapy and states she has been off antibiotics for 2 months.  She is currently asymptomatic     10/12/2022    2:30 PM 04/05/2021   10:53 AM 11/15/2019   10:40 AM 06/12/2019   11:46 AM  Depression screen PHQ 2/9  Decreased Interest 0 0 0 0  Down, Depressed, Hopeless 0 0 0 0  PHQ - 2 Score 0 0 0 0  Altered sleeping 0 0 0 1  Tired, decreased energy 0 0 0 1  Change in appetite 0 0 0 1  Feeling bad or failure about yourself  0 0 0 0  Trouble concentrating 0 0 0 0  Moving slowly or fidgety/restless 0 0 0 0  Suicidal thoughts 0 0 0 0  PHQ-9 Score 0 0 0 3  Difficult doing work/chores   Not difficult at all Not difficult at all    Today she reports occasional SOB with activity, improved with rest.  Notes h/o asthma- not using inhaler.   Contractions: Not present. Vag. Bleeding: None.  Movement: Present. denies leaking of fluid. Review of Systems:   Pertinent items are noted in HPI Denies abnormal vaginal discharge w/ itching/odor/irritation, headaches, visual changes, chest pain, abdominal pain, severe nausea/vomiting, or problems with urination or bowel movements unless otherwise stated above. Pertinent History Reviewed:  Reviewed past medical,surgical, social, obstetrical and family history.  Reviewed problem list, medications and allergies.  Physical Assessment:   Vitals:   12/28/22 1559  BP: 102/69  Pulse: 94  Weight: 157 lb 6.4 oz (71.4 kg)  Body mass index is 31.79 kg/m.        Physical Examination:   General appearance: Well appearing, and in no distress  Mental status: Alert,  oriented to person, place, and time  Skin: Warm & dry  Respiratory: Normal respiratory effort, no distress  Abdomen: Soft, gravid, nontender  Pelvic: Cervical exam deferred         Extremities: Edema: None  Psych:  mood and affect appropriate  Fetal Status: Fetal Heart Rate (bpm): 150 Fundal Height: 25 cm Movement: Present    Chaperone: n/a    No results found for this or any previous visit (from the past 24 hour(s)).   Assessment & Plan:  1) Low-risk pregnancy G2P1001 at [redacted]w[redacted]d with an Estimated Date of Delivery: 04/18/23   2) h/o pyelo -consider UCx q trimester since not on prophylaxis  -SOB- suspect pregnancy related Reviewed precautions Lab work next visit Ok to use inhaler if needed- should she note regular use of inhaler pt to let us know   Meds: No orders of the defined types were placed in this encounter.  Labs/procedures today: doppler  Plan:  Continue routine obstetrical care, PN2 next visit  Next visit: prefers in person    Reviewed: Preterm labor symptoms and general obstetric precautions including but not limited to vaginal bleeding, contractions, leaking of fluid and fetal movement were reviewed in detail with the patient.  All questions were answered.  Follow-up: Return in about 4 weeks (around 01/25/2023) for LROB visit and PN2.  No orders of the defined types were placed  in this encounter.   Myna Hidalgo, DO Attending Obstetrician & Gynecologist, [facility] for [employer], [facility]

## 2023-01-25 ENCOUNTER — Other Ambulatory Visit: Payer: Medicaid Other

## 2023-01-25 ENCOUNTER — Encounter: Payer: Medicaid Other | Admitting: Advanced Practice Midwife

## 2023-01-25 DIAGNOSIS — Z3480 Encounter for supervision of other normal pregnancy, unspecified trimester: Secondary | ICD-10-CM

## 2023-01-25 DIAGNOSIS — Z131 Encounter for screening for diabetes mellitus: Secondary | ICD-10-CM

## 2023-01-25 DIAGNOSIS — Z3A27 27 weeks gestation of pregnancy: Secondary | ICD-10-CM

## 2023-01-30 ENCOUNTER — Inpatient Hospital Stay (HOSPITAL_COMMUNITY): Payer: Medicaid Other

## 2023-01-30 ENCOUNTER — Inpatient Hospital Stay (HOSPITAL_COMMUNITY)
Admission: EM | Admit: 2023-01-30 | Discharge: 2023-01-30 | Disposition: A | Discharge status: Home / Self Care | Payer: Medicaid Other | Attending: Obstetrics & Gynecology | Admitting: Obstetrics & Gynecology

## 2023-01-30 ENCOUNTER — Encounter (HOSPITAL_COMMUNITY): Payer: Self-pay

## 2023-01-30 DIAGNOSIS — N930 Postcoital and contact bleeding: Secondary | ICD-10-CM | POA: Insufficient documentation

## 2023-01-30 DIAGNOSIS — Z1152 Encounter for screening for COVID-19: Secondary | ICD-10-CM | POA: Diagnosis not present

## 2023-01-30 DIAGNOSIS — R042 Hemoptysis: Secondary | ICD-10-CM | POA: Diagnosis not present

## 2023-01-30 DIAGNOSIS — Z3A28 28 weeks gestation of pregnancy: Secondary | ICD-10-CM | POA: Diagnosis not present

## 2023-01-30 DIAGNOSIS — R051 Acute cough: Secondary | ICD-10-CM | POA: Diagnosis not present

## 2023-01-30 DIAGNOSIS — O26853 Spotting complicating pregnancy, third trimester: Secondary | ICD-10-CM | POA: Diagnosis not present

## 2023-01-30 DIAGNOSIS — R1030 Lower abdominal pain, unspecified: Secondary | ICD-10-CM | POA: Insufficient documentation

## 2023-01-30 DIAGNOSIS — R058 Other specified cough: Secondary | ICD-10-CM | POA: Insufficient documentation

## 2023-01-30 DIAGNOSIS — O99893 Other specified diseases and conditions complicating puerperium: Secondary | ICD-10-CM | POA: Diagnosis not present

## 2023-01-30 DIAGNOSIS — O26899 Other specified pregnancy related conditions, unspecified trimester: Secondary | ICD-10-CM

## 2023-01-30 DIAGNOSIS — O26893 Other specified pregnancy related conditions, third trimester: Secondary | ICD-10-CM | POA: Diagnosis not present

## 2023-01-30 DIAGNOSIS — R109 Unspecified abdominal pain: Secondary | ICD-10-CM

## 2023-01-30 LAB — URINALYSIS, ROUTINE W REFLEX MICROSCOPIC
Bilirubin Urine: NEGATIVE
Glucose, UA: NEGATIVE mg/dL
Hgb urine dipstick: NEGATIVE
Ketones, ur: NEGATIVE mg/dL
Nitrite: NEGATIVE
Protein, ur: NEGATIVE mg/dL
Specific Gravity, Urine: 1.017 (ref 1.005–1.030)
WBC, UA: >50 WBC/hpf (ref 0–5)
pH: 6 (ref 5.0–8.0)

## 2023-01-30 LAB — RESP PANEL BY RT-PCR (RSV, FLU A&B, COVID)  RVPGX2
Influenza A by PCR: NEGATIVE
Influenza B by PCR: NEGATIVE
Resp Syncytial Virus by PCR: NEGATIVE
SARS Coronavirus 2 by RT PCR: NEGATIVE

## 2023-01-30 MED ORDER — GUAIFENESIN 100 MG/5ML PO LIQD: 100.0000 mg | ORAL | 0 refills | Status: DC | PRN

## 2023-01-30 MED ORDER — GUAIFENESIN ER 600 MG PO TB12: 600.0000 mg | ORAL_TABLET | Freq: Two times a day (BID) | ORAL | 1 refills | Status: DC | PRN

## 2023-01-30 NOTE — MAU Provider Note (Signed)
Chief Complaint:  Abdominal Cramping w/ Pregnancy  and Cough   Event Date/Time   First Provider Initiated Contact with Patient 01/30/23 2059     HPI: Rose Arnold is a 22 y.o. G2P1001 at 21w6dwho presents via [facility] from [facility] to maternity admissions reporting cough with bloody sputum.  Had cold symptoms a week ago with fever, cough has continued with development of blood in sputum today.  Had some vaginal spotting and cramping last week but none now.  Vaginal spotting occurred after intercourse which she states is about every few days. She reports good fetal movement, denies LOF, vaginal bleeding, h/a, dizziness, n/v, or fever/chills.    Cough This is a recurrent problem. The current episode started 1 to 4 weeks ago. Episode frequency: states frequent but we have not witnessed any coughing while here tonight. The cough is Productive of bloody sputum. Pertinent negatives include no chest pain, chills, ear congestion, fever, headaches, myalgias or nasal congestion. Nothing aggravates the symptoms. She has tried OTC cough suppressant for the symptoms. The treatment provided no relief.   RN Note: Rose Arnold is a 22 y.o. at [redacted]w[redacted]d here in MAU reporting: EMS transfer from [facility] where she was evaluated for  productive cough that began today was greenish-yellow mucous and has become bright red blood with clots at 0600 (see picture on pt phone)  Cough has continued through the day with smaller amounts of blood and yellow mucous/Reports fever last week with Tmax 105-107. Treated with cold medicine didn't see provider  Denies increase in frequency, dysuria or foul smell to urine.  Denies back pain  After fever 1 week ago started having pink spotting. Reports intermittent low abdominal cramping.  Denies SROM, vaginal discharge intermittently through the pregnancy.  Spotting began spotting after intercourse. Has had no spotting or discharge today    Past Medical History: Past Medical History:   Diagnosis Date   Allergy    Seasonal   Asthma    as a child   Gonorrhea 03/13/2019   Treated 03/13/19 with rocephin and azithromycin:   POC__________   Pre-diabetes     Past obstetric history: OB History  Gravida Para Term Preterm AB Living  2 1 1  0 0 1  SAB IAB Ectopic Multiple Live Births  0 0 0 0 1    # Outcome Date GA Lbr Len/2nd Weight Sex Type Anes PTL Lv  2 Current           1 Term 12/22/19 [redacted]w[redacted]d 06:18 / 00:45 3399 g F Vag-Spont EPI N LIV    Past Surgical History: Past Surgical History:  Procedure Laterality Date   NO PAST SURGERIES      Family History: Family History  Problem Relation Age of Onset   Asthma Paternal Aunt    Depression Maternal Grandmother    Hypertension Maternal Grandmother    COPD Maternal Grandfather        Hx of bronchitis   Asthma Paternal Grandfather    Healthy Mother     Social History: Social History   Tobacco Use   Smoking status: Never   Smokeless tobacco: Never  Vaping Use   Vaping status: Never Used  Substance Use Topics   Alcohol use: Not Currently    Comment: occ   Drug use: No    Allergies: No Known Allergies  Meds:  Medications Prior to Admission  Medication Sig Dispense Refill Last Dose/Taking   ferrous sulfate 325 (65 FE) MG EC tablet Take 1  tablet (325 mg total) by mouth every other day. 30 tablet 3 01/30/2023   Prenatal Vit-Fe Fumarate-FA (PRENATAL VITAMINS) 28-0.8 MG TABS Take by mouth.   01/30/2023   Blood Pressure Monitor MISC For regular home bp monitoring during pregnancy 1 each 0    ondansetron (ZOFRAN) 4 MG tablet Take 1 tablet (4 mg total) by mouth every 6 (six) hours. As needed for nausea vomiting 10 tablet 0     I have reviewed patient's Past Medical Hx, Surgical Hx, Family Hx, Social Hx, medications and allergies.   ROS:  Review of Systems  Constitutional:  Negative for chills and fever.  Respiratory:  Positive for cough.   Cardiovascular:  Negative for chest pain.  Musculoskeletal:   Negative for myalgias.  Neurological:  Negative for headaches.   Other systems negative  Physical Exam  Patient Vitals for the past 24 hrs:  BP Temp Temp src Pulse Resp SpO2 Height Weight  01/30/23 1959 108/72 98.2 F (36.8 C) Oral 93 16 100 % -- --  01/30/23 1830 106/63 -- -- 92 16 100 % -- --  01/30/23 1741 -- -- -- -- -- -- 4\' 11"  (1.499 m) 68.9 kg  01/30/23 1740 106/68 98.3 F (36.8 C) Oral (!) 101 20 98 % -- --   Constitutional: Well-developed, well-nourished female in no acute distress.  Cardiovascular: normal rate and rhythm Respiratory: normal effort, clear to auscultation bilaterally GI: Abd soft, non-tender, gravid appropriate for gestational age.   No rebound or guarding. MS: Extremities nontender, no edema, normal ROM Neurologic: Alert and oriented x 4.  GU: Neg CVAT.  PELVIC EXAM: Dilation: Closed Exam by:: Wynelle Bourgeois CNM No blood seen. Cervix is long.    FHT:  Baseline 140 , moderate variability, accelerations present, no decelerations Contractions: intermittent uterine irritability    Labs: Results for orders placed or performed during the hospital encounter of 01/30/23 (from the past 24 hours)  Urinalysis, Routine w reflex microscopic -Urine, Clean Catch     Status: Abnormal   Collection Time: 01/30/23  8:14 PM  Result Value Ref Range   Color, Urine YELLOW YELLOW   APPearance CLOUDY (A) CLEAR   Specific Gravity, Urine 1.017 1.005 - 1.030   pH 6.0 5.0 - 8.0   Glucose, UA NEGATIVE NEGATIVE mg/dL   Hgb urine dipstick NEGATIVE NEGATIVE   Bilirubin Urine NEGATIVE NEGATIVE   Ketones, ur NEGATIVE NEGATIVE mg/dL   Protein, ur NEGATIVE NEGATIVE mg/dL   Nitrite NEGATIVE NEGATIVE   Leukocytes,Ua LARGE (A) NEGATIVE   RBC / HPF 21-50 0 - 5 RBC/hpf   WBC, UA >50 0 - 5 WBC/hpf   Bacteria, UA FEW (A) NONE SEEN   Squamous Epithelial / HPF 21-50 0 - 5 /HPF   Mucus PRESENT   Resp panel by RT-PCR (RSV, Flu A&B, Covid) Anterior Nasal Swab     Status: None    Collection Time: 01/30/23  9:09 PM   Specimen: Anterior Nasal Swab  Result Value Ref Range   SARS Coronavirus 2 by RT PCR NEGATIVE NEGATIVE   Influenza A by PCR NEGATIVE NEGATIVE   Influenza B by PCR NEGATIVE NEGATIVE   Resp Syncytial Virus by PCR NEGATIVE NEGATIVE    O/Positive/-- (08/28 1515)  Imaging:  No results found.  MAU Course/MDM: I have reviewed the triage vital signs and the nursing notes.   Pertinent labs & imaging results that were available during my care of the patient were reviewed by me and considered in my medical decision making (see  chart for details).      I have reviewed her medical records including past results, notes and treatments.   I have ordered labs and reviewed results. Resp panel is negative.  Chest xray is negative NST reviewed, reassuring and reactive Consult Dr Crissie Reese with presentation, exam findings and test results. He states if CXR is negative then treat symptomatically Treatments in MAU included EFM.    Assessment: 1. [redacted] weeks gestation of pregnancy   2.     Cough with hemoptysis 3.     Postcoital bleeding 4.     Uterine cramping  Plan: Discharge home Rx Guaifenesin for cough Pelvic rest Preterm Labor precautions and fetal kick counts Follow up in Office for prenatal visits and recheck Encouraged to return if she develops worsening of symptoms, increase in pain, fever, or other concerning symptoms.   Pt stable at time of discharge.  Wynelle Bourgeois CNM, MSN Certified Nurse-Midwife 01/30/2023 8:59 PM

## 2023-01-30 NOTE — ED Triage Notes (Signed)
Pt c/o light vaginal bleeding and coughing x1 week, abdominal cramping since yesterday.  Pt reports being 28 weeks.    EDD 04/18/23  Pt is followed by [facility]

## 2023-01-30 NOTE — ED Notes (Signed)
Per rapid OB nurse pt is to be transferred to [facility] for exam and she is able to come off fetal heart monitor. EDP notified

## 2023-01-30 NOTE — ED Notes (Signed)
Pt does endorse to sexual intercourse two days ago, RROB to monitor, pt is hooked up to fetal monitoring

## 2023-01-30 NOTE — Progress Notes (Signed)
Pt reported to [facility] ED for cramping, bleeding and coughing. FHR reassuring> Dr Para March reviewed strip and asked for patient to be transferred to MAU for monitoring and exam.(1815).

## 2023-01-30 NOTE — MAU Note (Signed)
.  Rose Arnold is a 22 y.o. at [redacted]w[redacted]d here in MAU reporting: EMS transfer from [facility] where she was evaluated for  productive cough that began today was greenish-yellow mucous and has become bright red blood with clots at 0600 (see picture on pt phone)  Cough has continued through the day with smaller amounts of blood and yellow mucous/Reports fever last week with Tmax 105-107. Treated with cold medicine didn't see provider  Denies increase in frequency, dysuria or foul smell to urine.  Denies back pain  After fever 1 week ago started having pink spotting. Reports intermittent low abdominal cramping.  Denies SROM, vaginal discharge intermittently through the pregnancy.  Spotting began spotting after intercourse. Has had no spotting or discharge today   Pain score: 5 uterine cramping irregularly with increased episodes today while out running errands Vitals:   01/30/23 1830 01/30/23 1959  BP: 106/63 108/72  Pulse: 92 93  Resp: 16 16  Temp:  98.2 F (36.8 C)  SpO2: 100% 100%     FHT:135bpm Lab orders placed from triage:  UA

## 2023-01-30 NOTE — ED Provider Notes (Signed)
[place] EMERGENCY DEPARTMENT AT [facility] Provider Note   CSN: [id number] Arrival date & time: 01/30/23  1705     History  Chief Complaint  Patient presents with   Abdominal Cramping w/ Pregnancy    Cough    Rose Arnold is a 22 y.o. female.  Patient is 20 weeks 6 days pregnant.  Her due date is April 18, 2023.  Patient is here with some abdominal cramping since yesterday.  There is been some light vaginal bleeding.  [facility]  Patient last seen by them on November 13.  Patient is a gravida 2 para 1-0-0-1 cording to their notes she is a low risk pregnancy.  Patient's had a history of pyelonephritis.  When I saw her in November she been off antibiotics for 2 months.  And patient was asymptomatic at that time.  Upon arrival here patient had fetal monitoring.  Rapid response OB nurse contacted Dr. Para March on labor and delivery and they wanted patient transferred in for further monitoring.  They said she could be transferred without monitoring.  Patient's vital signs here are very reassuring.  No significant hypertension blood pressure was 106/68 temp 98.3 heart rate 101 respirations 20.  Patient saturations 98% on room air       Home Medications Prior to Admission medications   Medication Sig Start Date End Date Taking? Authorizing Provider  Blood Pressure Monitor MISC For regular home bp monitoring during pregnancy 10/12/22   Arabella Merles, CNM  ferrous sulfate 325 (65 FE) MG EC tablet Take 1 tablet (325 mg total) by mouth every other day. 10/14/22   Arabella Merles, CNM  ondansetron (ZOFRAN) 4 MG tablet Take 1 tablet (4 mg total) by mouth every 6 (six) hours. As needed for nausea vomiting 10/05/22   Triplett, Tammy, PA-C  Prenatal Vit-Fe Fumarate-FA (PRENATAL VITAMINS) 28-0.8 MG TABS Take by mouth.    [provider]      Allergies    Patient has no known allergies.    Review of Systems   Review of Systems   Constitutional:  Negative for chills and fever.  HENT:  Negative for ear pain and sore throat.   Eyes:  Negative for pain and visual disturbance.  Respiratory:  Negative for cough and shortness of breath.   Cardiovascular:  Negative for chest pain and palpitations.  Gastrointestinal:  Positive for abdominal pain. Negative for vomiting.  Genitourinary:  Positive for vaginal bleeding. Negative for dysuria and hematuria.  Musculoskeletal:  Negative for arthralgias and back pain.  Skin:  Negative for color change and rash.  Neurological:  Negative for seizures and syncope.  All other systems reviewed and are negative.   Physical Exam Updated Vital Signs BP 106/63   Pulse 92   Temp 98.3 F (36.8 C) (Oral)   Resp 16   Ht 1.499 m (4\' 11" )   Wt 68.9 kg   LMP 07/12/2022   SpO2 100%   BMI 30.70 kg/m  Physical Exam Vitals and nursing note reviewed.  Constitutional:      General: She is not in acute distress.    Appearance: She is well-developed. She is not ill-appearing.  HENT:     Head: Normocephalic and atraumatic.  Eyes:     Conjunctiva/sclera: Conjunctivae normal.  Cardiovascular:     Rate and Rhythm: Normal rate and regular rhythm.     Heart sounds: No murmur heard. Pulmonary:     Effort: Pulmonary effort is  normal. No respiratory distress.     Breath sounds: Normal breath sounds. No wheezing or rales.  Abdominal:     General: There is distension.     Palpations: Abdomen is soft.     Tenderness: There is no abdominal tenderness.     Comments: Gravid abdomen consistent with approximately [redacted] weeks gestation.  Musculoskeletal:        General: No swelling.     Cervical back: Neck supple.  Skin:    General: Skin is warm and dry.     Capillary Refill: Capillary refill takes less than 2 seconds.  Neurological:     General: No focal deficit present.     Mental Status: She is alert and oriented to person, place, and time.  Psychiatric:        Mood and Affect: Mood normal.      ED Results / Procedures / Treatments   Labs (all labs ordered are listed, but only abnormal results are displayed) Labs Reviewed - No data to display  EKG None  Radiology No results found.  Procedures Procedures    Medications Ordered in ED Medications - No data to display  ED Course/ Medical Decision Making/ A&P                                 Medical Decision Making Risk Decision regarding hospitalization.   As per rapid response OB nurse.  They are recommending that patient be sent to MAU L&D for further monitoring.  And exam.  They said the patient did not have to be monitored during transport.  They were not concerned about impending delivery.  Dr. Para March is the attending on L&D.  Both MAU PA and Dr. Para March notified by the rapid response OB nurse.  In addition patient did have the complaint of some coughing for about a week.  Patient in no respiratory stress here.  Abdomen soft and nontender.  No evidence of any fetal distress according to the rapid response OB nurse.   Final Clinical Impression(s) / ED Diagnoses Final diagnoses:  [redacted] weeks gestation of pregnancy    Rx / DC Orders ED Discharge Orders     None         Vanetta Mulders, MD 01/30/23 [id number]

## 2023-02-01 ENCOUNTER — Encounter: Payer: Self-pay | Admitting: Advanced Practice Midwife

## 2023-02-01 ENCOUNTER — Ambulatory Visit (INDEPENDENT_AMBULATORY_CARE_PROVIDER_SITE_OTHER): Payer: Medicaid Other | Admitting: Advanced Practice Midwife

## 2023-02-01 ENCOUNTER — Other Ambulatory Visit: Payer: Medicaid Other

## 2023-02-01 VITALS — BP 107/67 | HR 93 | Wt 162.0 lb

## 2023-02-01 DIAGNOSIS — Z3480 Encounter for supervision of other normal pregnancy, unspecified trimester: Secondary | ICD-10-CM | POA: Diagnosis not present

## 2023-02-01 DIAGNOSIS — D649 Anemia, unspecified: Secondary | ICD-10-CM

## 2023-02-01 DIAGNOSIS — Z23 Encounter for immunization: Secondary | ICD-10-CM

## 2023-02-01 DIAGNOSIS — O99013 Anemia complicating pregnancy, third trimester: Secondary | ICD-10-CM

## 2023-02-01 DIAGNOSIS — Z131 Encounter for screening for diabetes mellitus: Secondary | ICD-10-CM | POA: Diagnosis not present

## 2023-02-01 DIAGNOSIS — D509 Iron deficiency anemia, unspecified: Secondary | ICD-10-CM | POA: Diagnosis not present

## 2023-02-01 DIAGNOSIS — Z3A28 28 weeks gestation of pregnancy: Secondary | ICD-10-CM

## 2023-02-01 DIAGNOSIS — Z3A29 29 weeks gestation of pregnancy: Secondary | ICD-10-CM

## 2023-02-01 DIAGNOSIS — O2302 Infections of kidney in pregnancy, second trimester: Secondary | ICD-10-CM

## 2023-02-01 NOTE — Patient Instructions (Signed)
Natascha, thank you for choosing our office today! We appreciate the opportunity to meet your healthcare needs. You may receive a short survey by mail, e-mail, or through MyChart. If you are happy with your care we would appreciate if you could take just a few minutes to complete the survey questions. We read all of your comments and take your feedback very seriously. Thank you again for choosing our office.  [facility] ([address] [place], [place] [address]) Entrance C, located off of [address] Free 24/7 valet parking   CLASSES: Go to [email or web address] to register for classes (childbirth, breastfeeding, waterbirth, infant CPR, daddy bootcamp, etc.)  Call the office ([phone]) or go to [facility] if: You begin to have strong, frequent contractions Your water breaks.  Sometimes it is a big gush of fluid, sometimes it is just a trickle that keeps getting your panties wet or running down your legs You have vaginal bleeding.  It is normal to have a small amount of spotting if your cervix was checked.  You don't feel your baby moving like normal.  If you don't, get you something to eat and drink and lay down and focus on feeling your baby move.   If your baby is still not moving like normal, you should call the office or go to [facility].  Call the office ([phone]) or go to [facility] for these signs of pre-eclampsia: Severe headache that does not go away with Tylenol Visual changes- seeing spots, double, blurred vision Pain under your right breast or upper abdomen that does not go away with Tums or heartburn medicine Nausea and/or vomiting Severe swelling in your hands, feet, and face   Tdap Vaccine It is recommended that you get the Tdap vaccine during the third trimester of EACH pregnancy to help protect your baby from getting pertussis (whooping cough) 27-36 weeks is the BEST time to do  this so that you can pass the protection on to your baby. During pregnancy is better than after pregnancy, but if you are unable to get it during pregnancy it will be offered at the hospital.  You can get this vaccine with us, at the health department, your family doctor, or some local pharmacies Everyone who will be around your baby should also be up-to-date on their vaccines before the baby comes. Adults (who are not pregnant) only need 1 dose of Tdap during adulthood.   [place] Pediatricians/Family Doctors [facility] ([facility]): [address], [phone]           [facility]: [address], [phone]                [facility] ([facility]): [address], [phone] (call to ask if accepting patients) [facility]: [address], [place], [phone]    [place] Pediatricians/Family Doctors [facility] ([facility]): [address], [phone] [facility]: [address], [phone] [facility]: [address], [phone]  [place] [facility]  [facility] ([facility]): [phone] [facility]: [address], [phone]   [place] Family Doctors [facility]: [address], [phone]  [place] [facility]  [facility]: [address], [phone]  Home Blood Pressure Monitoring for Patients   Your provider has recommended that you check your   blood pressure (BP) at least once a week at home. If you do not have a blood pressure cuff at home, one will be provided for you. Contact your provider if you have not received your monitor within 1 week.   Helpful Tips for Accurate Home Blood Pressure Checks  Don't smoke, exercise, or drink caffeine 30 minutes before checking your BP Use the restroom before checking your BP (a full bladder can raise your  pressure) Relax in a comfortable upright chair Feet on the ground Left arm resting comfortably on a flat surface at the level of your heart Legs uncrossed Back supported Sit quietly and don't talk Place the cuff on your bare arm Adjust snuggly, so that only two fingertips can fit between your skin and the top of the cuff Check 2 readings separated by at least one minute Keep a log of your BP readings For a visual, please reference this diagram: http://ccnc.care/bpdiagram  Provider Name: [facility]     Phone: [phone]  Zone 1: ALL CLEAR  Continue to monitor your symptoms:  BP reading is less than 140 (top number) or less than 90 (bottom number)  No right upper stomach pain No headaches or seeing spots No feeling nauseated or throwing up No swelling in face and hands  Zone 2: CAUTION Call your doctor's office for any of the following:  BP reading is greater than 140 (top number) or greater than 90 (bottom number)  Stomach pain under your ribs in the middle or right side Headaches or seeing spots Feeling nauseated or throwing up Swelling in face and hands  Zone 3: EMERGENCY  Seek immediate medical care if you have any of the following:  BP reading is greater than160 (top number) or greater than 110 (bottom number) Severe headaches not improving with Tylenol Serious difficulty catching your breath Any worsening symptoms from Zone 2   Third Trimester of Pregnancy The third trimester is from week 29 through week 42, months 7 through 9. The third trimester is a time when the fetus is growing rapidly. At the end of the ninth month, the fetus is about 20 inches in length and weighs 6-10 pounds.  BODY CHANGES Your body goes through many changes during pregnancy. The changes vary from woman to woman.  Your weight will continue to increase. You can expect to gain 25-35 pounds (11-16 kg) by the end of the pregnancy. You may begin to get stretch marks on your hips, abdomen,  and breasts. You may urinate more often because the fetus is moving lower into your pelvis and pressing on your bladder. You may develop or continue to have heartburn as a result of your pregnancy. You may develop constipation because certain hormones are causing the muscles that push waste through your intestines to slow down. You may develop hemorrhoids or swollen, bulging veins (varicose veins). You may have pelvic pain because of the weight gain and pregnancy hormones relaxing your joints between the bones in your pelvis. Backaches may result from overexertion of the muscles supporting your posture. You may have changes in your hair. These can include thickening of your hair, rapid growth, and changes in texture. Some women also have hair loss during or after pregnancy, or hair that feels dry or thin. Your hair will most likely return to normal after your baby is born. Your breasts will continue to grow and be tender. A yellow discharge may leak from your breasts called colostrum. Your belly button may stick out. You may   feel short of breath because of your expanding uterus. You may notice the fetus "dropping," or moving lower in your abdomen. You may have a bloody mucus discharge. This usually occurs a few days to a week before labor begins. Your cervix becomes thin and soft (effaced) near your due date. WHAT TO EXPECT AT YOUR PRENATAL EXAMS  You will have prenatal exams every 2 weeks until week 36. Then, you will have weekly prenatal exams. During a routine prenatal visit: You will be weighed to make sure you and the fetus are growing normally. Your blood pressure is taken. Your abdomen will be measured to track your baby's growth. The fetal heartbeat will be listened to. Any test results from the previous visit will be discussed. You may have a cervical check near your due date to see if you have effaced. At around 36 weeks, your caregiver will check your cervix. At the same time, your  caregiver will also perform a test on the secretions of the vaginal tissue. This test is to determine if a type of bacteria, Group B streptococcus, is present. Your caregiver will explain this further. Your caregiver may ask you: What your birth plan is. How you are feeling. If you are feeling the baby move. If you have had any abnormal symptoms, such as leaking fluid, bleeding, severe headaches, or abdominal cramping. If you have any questions. Other tests or screenings that may be performed during your third trimester include: Blood tests that check for low iron levels (anemia). Fetal testing to check the health, activity level, and growth of the fetus. Testing is done if you have certain medical conditions or if there are problems during the pregnancy. FALSE LABOR You may feel small, irregular contractions that eventually go away. These are called Braxton Hicks contractions, or false labor. Contractions may last for hours, days, or even weeks before true labor sets in. If contractions come at regular intervals, intensify, or become painful, it is best to be seen by your caregiver.  SIGNS OF LABOR  Menstrual-like cramps. Contractions that are 5 minutes apart or less. Contractions that start on the top of the uterus and spread down to the lower abdomen and back. A sense of increased pelvic pressure or back pain. A watery or bloody mucus discharge that comes from the vagina. If you have any of these signs before the 37th week of pregnancy, call your caregiver right away. You need to go to the hospital to get checked immediately. HOME CARE INSTRUCTIONS  Avoid all smoking, herbs, alcohol, and unprescribed drugs. These chemicals affect the formation and growth of the baby. Follow your caregiver's instructions regarding medicine use. There are medicines that are either safe or unsafe to take during pregnancy. Exercise only as directed by your caregiver. Experiencing uterine cramps is a good sign to  stop exercising. Continue to eat regular, healthy meals. Wear a good support bra for breast tenderness. Do not use hot tubs, steam rooms, or saunas. Wear your seat belt at all times when driving. Avoid raw meat, uncooked cheese, cat litter boxes, and soil used by cats. These carry germs that can cause birth defects in the baby. Take your prenatal vitamins. Try taking a stool softener (if your caregiver approves) if you develop constipation. Eat more high-fiber foods, such as fresh vegetables or fruit and whole grains. Drink plenty of fluids to keep your urine clear or pale yellow. Take warm sitz baths to soothe any pain or discomfort caused by hemorrhoids. Use hemorrhoid cream if   your caregiver approves. If you develop varicose veins, wear support hose. Elevate your feet for 15 minutes, 3-4 times a day. Limit salt in your diet. Avoid heavy lifting, wear low heal shoes, and practice good posture. Rest a lot with your legs elevated if you have leg cramps or low back pain. Visit your dentist if you have not gone during your pregnancy. Use a soft toothbrush to brush your teeth and be gentle when you floss. A sexual relationship may be continued unless your caregiver directs you otherwise. Do not travel far distances unless it is absolutely necessary and only with the approval of your caregiver. Take prenatal classes to understand, practice, and ask questions about the labor and delivery. Make a trial run to the hospital. Pack your hospital bag. Prepare the baby's nursery. Continue to go to all your prenatal visits as directed by your caregiver. SEEK MEDICAL CARE IF: You are unsure if you are in labor or if your water has broken. You have dizziness. You have mild pelvic cramps, pelvic pressure, or nagging pain in your abdominal area. You have persistent nausea, vomiting, or diarrhea. You have a bad smelling vaginal discharge. You have pain with urination. SEEK IMMEDIATE MEDICAL CARE IF:  You  have a fever. You are leaking fluid from your vagina. You have spotting or bleeding from your vagina. You have severe abdominal cramping or pain. You have rapid weight loss or gain. You have shortness of breath with chest pain. You notice sudden or extreme swelling of your face, hands, ankles, feet, or legs. You have not felt your baby move in over an hour. You have severe headaches that do not go away with medicine. You have vision changes. Document Released: 01/25/2001 Document Revised: 02/05/2013 Document Reviewed: 04/03/2012 [facility] Patient Information 2015 [place], [place]. This information is not intended to replace advice given to you by your health care provider. Make sure you discuss any questions you have with your health care provider.

## 2023-02-01 NOTE — Progress Notes (Signed)
   LOW-RISK PREGNANCY VISIT Patient name: Rose Arnold MRN [id number]  Date of birth: July 26, 2000 Chief Complaint:   Routine Prenatal Visit (PN2/ tdap information given)  History of Present Illness:   Rose Arnold is a 22 y.o. G24P1001 female at [redacted]w[redacted]d with an Estimated Date of Delivery: 04/18/23 being seen today for ongoing management of a low-risk pregnancy.  Today she reports  being seen in [facility] 12/16 for bloody sputum with cough> d/c home with Mucinex and is doing better . Contractions: Not present.  .  Movement: Present. denies leaking of fluid. Review of Systems:   Pertinent items are noted in HPI Denies abnormal vaginal discharge w/ itching/odor/irritation, headaches, visual changes, shortness of breath, chest pain, abdominal pain, severe nausea/vomiting, or problems with urination or bowel movements unless otherwise stated above. Pertinent History Reviewed:  Reviewed past medical,surgical, social, obstetrical and family history.  Reviewed problem list, medications and allergies. Physical Assessment:   Vitals:   02/01/23 0850  BP: 107/67  Pulse: 93  Weight: 162 lb (73.5 kg)  Body mass index is 32.72 kg/m.        Physical Examination:   General appearance: Well appearing, and in no distress  Mental status: Alert, oriented to person, place, and time  Skin: Warm & dry  Cardiovascular: Normal heart rate noted  Respiratory: Normal respiratory effort, no distress  Abdomen: Soft, gravid, nontender  Pelvic: Cervical exam deferred         Extremities: Edema: None  Fetal Status: Fetal Heart Rate (bpm): 138 Fundal Height: 28 cm Movement: Present    No results found for this or any previous visit (from the past 24 hours).  Assessment & Plan:  1) Low-risk pregnancy G2P1001 at [redacted]w[redacted]d with an Estimated Date of Delivery: 04/18/23     Meds: No orders of the defined types were placed in this encounter.  Labs/procedures today: PN2; Tdap given  Plan:  Continue routine obstetrical care    Reviewed: Preterm labor symptoms and general obstetric precautions including but not limited to vaginal bleeding, contractions, leaking of fluid and fetal movement were reviewed in detail with the patient.  All questions were answered. Has home bp cuff. Check bp weekly, let us know if >140/90.   Follow-up: Return in about 2 weeks (around 02/15/2023) for LROB.  Orders Placed This Encounter  Procedures   Tdap vaccine greater than or equal to 7yo IM   Arabella Merles [facility] 02/01/2023 9:48 AM

## 2023-02-02 ENCOUNTER — Encounter: Payer: Self-pay | Admitting: Women's Health

## 2023-02-02 LAB — CBC
Hematocrit: 25.4 % — ABNORMAL LOW (ref 34.0–46.6)
Hemoglobin: 8.4 g/dL — ABNORMAL LOW (ref 11.1–15.9)
MCH: 27.6 pg (ref 26.6–33.0)
MCHC: 33.1 g/dL (ref 31.5–35.7)
MCV: 84 fL (ref 79–97)
Platelets: 191 10*3/uL (ref 150–450)
RBC: 3.04 x10E6/uL — ABNORMAL LOW (ref 3.77–5.28)
RDW: 12.1 % (ref 11.7–15.4)
WBC: 6.6 10*3/uL (ref 3.4–10.8)

## 2023-02-02 LAB — GLUCOSE TOLERANCE, 2 HOURS W/ 1HR
Glucose, 1 hour: 106 mg/dL (ref 70–179)
Glucose, 2 hour: 88 mg/dL (ref 70–152)
Glucose, Fasting: 76 mg/dL (ref 70–91)

## 2023-02-02 LAB — ANTIBODY SCREEN: Antibody Screen: NEGATIVE

## 2023-02-02 LAB — RPR: RPR Ser Ql: NONREACTIVE

## 2023-02-02 LAB — HIV ANTIBODY (ROUTINE TESTING W REFLEX): HIV Screen 4th Generation wRfx: NONREACTIVE

## 2023-02-03 ENCOUNTER — Other Ambulatory Visit: Payer: Self-pay | Admitting: Advanced Practice Midwife

## 2023-02-06 ENCOUNTER — Encounter: Payer: Self-pay | Admitting: Women's Health

## 2023-02-07 LAB — SPECIMEN STATUS REPORT

## 2023-02-10 LAB — IRON AND TIBC
Iron Saturation: 8 % — CL (ref 15–55)
Iron: 37 ug/dL (ref 27–159)
Total Iron Binding Capacity: 484 ug/dL — ABNORMAL HIGH (ref 250–450)
UIBC: 447 ug/dL — ABNORMAL HIGH (ref 131–425)

## 2023-02-10 LAB — FERRITIN: Ferritin: 11 ng/mL — ABNORMAL LOW (ref 15–150)

## 2023-02-10 LAB — SPECIMEN STATUS REPORT

## 2023-02-13 ENCOUNTER — Other Ambulatory Visit: Payer: Self-pay | Admitting: Women's Health

## 2023-02-15 NOTE — L&D Delivery Note (Cosign Needed Addendum)
 OB/GYN Faculty Practice Delivery Note  Rose Arnold is a 23 y.o. G2P1001 s/p SVD at [redacted]w[redacted]d. She was admitted for variable decelerations on monitoring in latent labor.   ROM: 17h 24m with clear fluid GBS Status: Negative/-- (02/20 1630) Maximum Maternal Temperature: Temp (24hrs), Avg:98.2 F (36.8 C), Min:97.7 F (36.5 C), Max:98.8 F (37.1 C)  Labor Progress: Initial SVE: 4.5/70/-2. AROM and Pitocin required. She then progressed to complete.   Delivery Date/Time: 04/15/23 1907 Delivery: Called to room and patient was crowning. Head delivered OA to LOA. Right hand/elbow found to be compound which impeded delivery of posterior shoulder. This was reduced and then babe delivered easily. No nuchal cord present. Infant with spontaneous cry, placed on mother's abdomen, dried and stimulated. Cord clamped x 2 after +1-minute delay, and cut by FOB. Cord gases not obtained. Cord blood drawn. Placenta delivered spontaneously with gentle cord traction. Fundus firm with massage and Pitocin. Labia, perineum, and vagina inspected with no lacerations. Mom and baby to postpartum. TXA given for prophylaxis given Hgb 7.7 at admission. Baby Weight: pending  Placenta: 3 vessel, intact. Sent to L&D Complications: None Lacerations: None EBL: 136 mL Anesthesia: epidural  Infant: baby girl [place] APGAR (1 MIN): 8  APGAR (5 MINS): 9  APGAR (10 MINS):    Joanne Gavel, MD [facility] Family Medicine Fellow, [facility], [facility] 04/15/2023, 7:41 PM

## 2023-02-16 ENCOUNTER — Encounter: Payer: Self-pay | Admitting: Women's Health

## 2023-02-16 ENCOUNTER — Telehealth: Payer: Self-pay

## 2023-02-16 ENCOUNTER — Encounter: Payer: Medicaid Other | Admitting: Women's Health

## 2023-02-16 ENCOUNTER — Other Ambulatory Visit: Payer: Self-pay

## 2023-02-16 NOTE — Telephone Encounter (Signed)
 Hello,  Reached out to patient to see if she would like to come to the [place] location with no response, Left voicemail. If patient would like to come to the [place] location, have her call [phone]. Thanks  Auth Submission: NO AUTH NEEDED Site of care: [facility] Payer: Medicaid Medication & CPT/J Code(s) submitted: Venofer  (Iron  Sucrose) J1756 Route of submission (phone, fax, portal): phone Phone # Fax # Auth type: Buy/Bill HB Units/visits requested: 500mg , 2 doses Reference number:  Approval from: 02/16/23 to 02/14/24

## 2023-02-20 ENCOUNTER — Ambulatory Visit: Payer: Medicaid Other

## 2023-02-23 ENCOUNTER — Encounter: Payer: Medicaid Other | Admitting: Advanced Practice Midwife

## 2023-03-02 ENCOUNTER — Ambulatory Visit: Payer: Medicaid Other | Admitting: Women's Health

## 2023-03-02 ENCOUNTER — Encounter: Payer: Self-pay | Admitting: Women's Health

## 2023-03-02 VITALS — BP 109/68 | HR 111 | Wt 161.0 lb

## 2023-03-02 DIAGNOSIS — Z2911 Encounter for prophylactic immunotherapy for respiratory syncytial virus (RSV): Secondary | ICD-10-CM

## 2023-03-02 DIAGNOSIS — Z23 Encounter for immunization: Secondary | ICD-10-CM | POA: Diagnosis not present

## 2023-03-02 DIAGNOSIS — Z3480 Encounter for supervision of other normal pregnancy, unspecified trimester: Secondary | ICD-10-CM

## 2023-03-02 DIAGNOSIS — Z3A33 33 weeks gestation of pregnancy: Secondary | ICD-10-CM

## 2023-03-02 DIAGNOSIS — Z3483 Encounter for supervision of other normal pregnancy, third trimester: Secondary | ICD-10-CM

## 2023-03-02 NOTE — Progress Notes (Signed)
LOW-RISK PREGNANCY VISIT Patient name: Rose Arnold MRN [id number]  Date of birth: 05-16-2000 Chief Complaint:   Routine Prenatal Visit  History of Present Illness:   Rose Arnold is a 23 y.o. G40P1001 female at [redacted]w[redacted]d with an Estimated Date of Delivery: 04/18/23 being seen today for ongoing management of a low-risk pregnancy.   Today she reports no complaints. Missed IV Fe appt.  Contractions: Not present.  .  Movement: Present. denies leaking of fluid.     02/01/2023    9:04 AM 10/12/2022    2:30 PM 04/05/2021   10:53 AM 11/15/2019   10:40 AM 06/12/2019   11:46 AM  Depression screen PHQ 2/9  Decreased Interest 0 0 0 0 0  Down, Depressed, Hopeless 0 0 0 0 0  PHQ - 2 Score 0 0 0 0 0  Altered sleeping 0 0 0 0 1  Tired, decreased energy 0 0 0 0 1  Change in appetite 0 0 0 0 1  Feeling bad or failure about yourself  0 0 0 0 0  Trouble concentrating 0 0 0 0 0  Moving slowly or fidgety/restless 0 0 0 0 0  Suicidal thoughts  0 0 0 0  PHQ-9 Score 0 0 0 0 3  Difficult doing work/chores    Not difficult at all Not difficult at all        10/12/2022    2:30 PM 04/05/2021   10:53 AM 11/15/2019   10:41 AM 06/12/2019   11:46 AM  GAD 7 : Generalized Anxiety Score  Nervous, Anxious, on Edge 0 0 0 0  Control/stop worrying 0 0 0 0  Worry too much - different things 0 0 0 1  Trouble relaxing 0 0 0 0  Restless 0 0 0 0  Easily annoyed or irritable 0 0 0 0  Afraid - awful might happen 0 0 0 0  Total GAD 7 Score 0 0 0 1  Anxiety Difficulty   Not difficult at all Not difficult at all      Review of Systems:   Pertinent items are noted in HPI Denies abnormal vaginal discharge w/ itching/odor/irritation, headaches, visual changes, shortness of breath, chest pain, abdominal pain, severe nausea/vomiting, or problems with urination or bowel movements unless otherwise stated above. Pertinent History Reviewed:  Reviewed past medical,surgical, social, obstetrical and family history.  Reviewed  problem list, medications and allergies. Physical Assessment:   Vitals:   03/02/23 1544  BP: 109/68  Pulse: (!) 111  Weight: 161 lb (73 kg)  Body mass index is 32.52 kg/m.        Physical Examination:   General appearance: Well appearing, and in no distress  Mental status: Alert, oriented to person, place, and time  Skin: Warm & dry  Cardiovascular: Normal heart rate noted  Respiratory: Normal respiratory effort, no distress  Abdomen: Soft, gravid, nontender  Pelvic: Cervical exam deferred         Extremities: Edema: None  Fetal Status: Fetal Heart Rate (bpm): 142 Fundal Height: 33 cm Movement: Present    Chaperone: N/A   No results found for this or any previous visit (from the past 24 hours).  Assessment & Plan:  1) Low-risk pregnancy G2P1001 at [redacted]w[redacted]d with an Estimated Date of Delivery: 04/18/23   2) Anemia, missed IV Fe appt, said they would call this week to reschedule. Advised pt to call them today to reschedule   Meds: No orders of the defined types were placed  in this encounter.  Labs/procedures today: flu shot and RSV vaccine  Plan:  Continue routine obstetrical care  Next visit: prefers in person    Reviewed: Preterm labor symptoms and general obstetric precautions including but not limited to vaginal bleeding, contractions, leaking of fluid and fetal movement were reviewed in detail with the patient.  All questions were answered. Does have home bp cuff. Office bp cuff given: not applicable. Check bp weekly, let us know if consistently >140 and/or >90.  Follow-up: Return for As scheduled.  Future Appointments  Date Time Provider Department Center  03/16/2023  4:10 PM Cheral Marker, [place] [facility]  03/30/2023  4:10 PM Cheral Marker, CNM [facility]  04/06/2023 10:10 AM Cheral Marker, CNM [facility]  04/13/2023  4:10 PM Cheral Marker, CNM [facility]    Orders Placed This Encounter  Procedures   Flu vaccine trivalent PF, 6mos and  older(Flulaval,Afluria,Fluarix,Fluzone)   Respiratory syncytial virus vaccine, preF, subunit, bivalent,(Abrysvo)   Cheral Marker CNM, [facility] 03/02/2023 4:09 PM

## 2023-03-02 NOTE — Patient Instructions (Signed)
[place], thank you for choosing our office today! We appreciate the opportunity to meet your healthcare needs. You may receive a short survey by mail, e-mail, or through [employer]. If you are happy with your care we would appreciate if you could take just a few minutes to complete the survey questions. We read all of your comments and take your feedback very seriously. Thank you again for choosing our office.  Center for [employer] Team at [facility]  [facility] at [facility] ([address] [place], [place] [address]) Entrance C, located off of [address] 24/7 valet parking   CLASSES: Go to [email or web address] to register for classes (childbirth, breastfeeding, waterbirth, infant CPR, daddy bootcamp, etc.)  Call the office [phone]) or go to [facility] if: You begin to have strong, frequent contractions Your water breaks.  Sometimes it is a big gush of fluid, sometimes it is just a trickle that keeps getting your panties wet or running down your legs You have vaginal bleeding.  It is normal to have a small amount of spotting if your cervix was checked.  You don't feel your baby moving like normal.  If you don't, get you something to eat and drink and lay down and focus on feeling your baby move.   If your baby is still not moving like normal, you should call the office or go to [facility].  Call the office [phone]) or go to [facility] hospital for these signs of pre-eclampsia: Severe headache that does not go away with Tylenol Visual changes- seeing spots, double, blurred vision Pain under your right breast or upper abdomen that does not go away with Tums or heartburn medicine Nausea and/or vomiting Severe swelling in your hands, feet, and face   Tdap Vaccine It is recommended that you get the Tdap vaccine during the third trimester of EACH pregnancy to help protect your baby from getting pertussis (whooping cough) 27-36 weeks is the BEST time to do  this so that you can pass the protection on to your baby. During pregnancy is better than after pregnancy, but if you are unable to get it during pregnancy it will be offered at the hospital.  You can get this vaccine with [place], at the health department, your family doctor, or some local pharmacies Everyone who will be around your baby should also be up-to-date on their vaccines before the baby comes. Adults (who are not pregnant) only need 1 dose of Tdap during adulthood.   [facility] Pediatricians/Family Doctors [facility]): [address] Colette Ribas, [phone]           [facility]: [address], [phone]                [facility]): [address], [phone] (call to ask if accepting patients) [facility] Department: [address], [place], [phone]    [facility] Pediatricians/Family Doctors [facility]): [phone] [address], [phone] [facility]: [address] [place], [phone] [facility]: [address], [phone]  [facility] Doctors  [facility]): [phone] [facility]: [address] [phone]   [facility] Doctors [facility]: [address] [phone]  [facility] Family Doctors  [facility]: [phone], [phone]  Home Blood Pressure Monitoring for Patients   Your provider has recommended that you check your  blood pressure (BP) at least once a week at home. If you do not have a blood pressure cuff at home, one will be provided for you. Contact your provider if you have not received your monitor within 1 week.   Helpful Tips for Accurate Home Blood Pressure Checks  Don't smoke, exercise, or drink caffeine 30 minutes before checking your BP Use the restroom before checking your BP (a full bladder can raise your  pressure) Relax in a comfortable upright chair Feet on the ground Left arm resting comfortably on a flat surface at the level of your heart Legs uncrossed Back supported Sit quietly and don't talk Place the cuff on your bare arm Adjust snuggly, so that only two fingertips can fit between your skin and the top of the cuff Check 2 readings separated by at least one minute Keep a log of your BP readings For a visual, please reference this diagram: http://ccnc.care/bpdiagram  Provider Name: [facility]     Phone: [phone]  Zone 1: ALL CLEAR  Continue to monitor your symptoms:  BP reading is less than 140 (top number) or less than 90 (bottom number)  No right upper stomach pain No headaches or seeing spots No feeling nauseated or throwing up No swelling in face and hands  Zone 2: CAUTION Call your doctor's office for any of the following:  BP reading is greater than 140 (top number) or greater than 90 (bottom number)  Stomach pain under your ribs in the middle or right side Headaches or seeing spots Feeling nauseated or throwing up Swelling in face and hands  Zone 3: EMERGENCY  Seek immediate medical care if you have any of the following:  BP reading is greater than160 (top number) or greater than 110 (bottom number) Severe headaches not improving with Tylenol Serious difficulty catching your breath Any worsening symptoms from Zone 2  Preterm Labor and Birth Information  The normal length of a pregnancy is 39-41 weeks. Preterm labor is when labor starts before 37 completed weeks of pregnancy. What are the risk factors for preterm labor? Preterm labor is more likely to occur in women who: Have certain infections during pregnancy such as a bladder infection, sexually transmitted infection, or infection inside the uterus (chorioamnionitis). Have a shorter-than-normal cervix. Have gone into preterm labor before. Have had surgery on their cervix. Are younger than age 17  or older than age 35. Are African American. Are pregnant with twins or multiple babies (multiple gestation). Take street drugs or smoke while pregnant. Do not gain enough weight while pregnant. Became pregnant shortly after having been pregnant. What are the symptoms of preterm labor? Symptoms of preterm labor include: Cramps similar to those that can happen during a menstrual period. The cramps may happen with diarrhea. Pain in the abdomen or lower back. Regular uterine contractions that may feel like tightening of the abdomen. A feeling of increased pressure in the pelvis. Increased watery or bloody mucus discharge from the vagina. Water breaking (ruptured amniotic sac). Why is it important to recognize signs of preterm labor? It is important to recognize signs of preterm labor because babies who are born prematurely may not be fully developed. This can put them at an increased risk for: Long-term (chronic) heart and lung problems. Difficulty immediately after birth with regulating body systems, including blood sugar, body temperature, heart rate, and breathing rate. Bleeding in the brain. Cerebral palsy. Learning difficulties. Death. These risks are highest for babies who are born before 34 weeks   of pregnancy. How is preterm labor treated? Treatment depends on the length of your pregnancy, your condition, and the health of your baby. It may involve: Having a stitch (suture) placed in your cervix to prevent your cervix from opening too early (cerclage). Taking or being given medicines, such as: Hormone medicines. These may be given early in pregnancy to help support the pregnancy. Medicine to stop contractions. Medicines to help mature the baby's lungs. These may be prescribed if the risk of delivery is high. Medicines to prevent your baby from developing cerebral palsy. If the labor happens before 34 weeks of pregnancy, you may need to stay in the hospital. What should I do if I  think I am in preterm labor? If you think that you are going into preterm labor, call your health care provider right away. How can I prevent preterm labor in future pregnancies? To increase your chance of having a full-term pregnancy: Do not use any tobacco products, such as cigarettes, chewing tobacco, and e-cigarettes. If you need help quitting, ask your health care provider. Do not use street drugs or medicines that have not been prescribed to you during your pregnancy. Talk with your health care provider before taking any herbal supplements, even if you have been taking them regularly. Make sure you gain a healthy amount of weight during your pregnancy. Watch for infection. If you think that you might have an infection, get it checked right away. Make sure to tell your health care provider if you have gone into preterm labor before. This information is not intended to replace advice given to you by your health care provider. Make sure you discuss any questions you have with your health care provider. Document Revised: 05/25/2018 Document Reviewed: 06/24/2015 Elsevier Patient Education  2020 Elsevier Inc.   

## 2023-03-07 ENCOUNTER — Telehealth: Payer: Self-pay | Admitting: *Deleted

## 2023-03-07 NOTE — Telephone Encounter (Signed)
LMOVM to check into see if patient had contacted the infusion center for IV iron.  Advised to call our office if she had any question.

## 2023-03-16 ENCOUNTER — Encounter: Payer: Self-pay | Admitting: Women's Health

## 2023-03-16 ENCOUNTER — Ambulatory Visit: Payer: Medicaid Other | Admitting: Women's Health

## 2023-03-16 VITALS — BP 112/68 | HR 97 | Wt 160.0 lb

## 2023-03-16 DIAGNOSIS — Z3483 Encounter for supervision of other normal pregnancy, third trimester: Secondary | ICD-10-CM

## 2023-03-16 DIAGNOSIS — D649 Anemia, unspecified: Secondary | ICD-10-CM

## 2023-03-16 DIAGNOSIS — Z3A35 35 weeks gestation of pregnancy: Secondary | ICD-10-CM

## 2023-03-16 DIAGNOSIS — O99013 Anemia complicating pregnancy, third trimester: Secondary | ICD-10-CM

## 2023-03-16 NOTE — Patient Instructions (Addendum)
[place], thank you for choosing our office today! We appreciate the opportunity to meet your healthcare needs. You may receive a short survey by mail, e-mail, or through [employer]. If you are happy with your care we would appreciate if you could take just a few minutes to complete the survey questions. We read all of your comments and take your feedback very seriously. Thank you again for choosing our office.  Center for [employer] Team at [facility]  [facility] at [facility] ([address] [place], [place] [address]) Entrance C, located off of [address] 24/7 valet parking   *PLEASE CALL [phone] TO GET YOUR IRON TRANSFUSION SCHEDULED ASAP*  CLASSES: Go to [email or web address] to register for classes (childbirth, breastfeeding, waterbirth, infant CPR, daddy bootcamp, etc.)  Call the office [phone]) or go to [facility] if: You begin to have strong, frequent contractions Your water breaks.  Sometimes it is a big gush of fluid, sometimes it is just a trickle that keeps getting your panties wet or running down your legs You have vaginal bleeding.  It is normal to have a small amount of spotting if your cervix was checked.  You don't feel your baby moving like normal.  If you don't, get you something to eat and drink and lay down and focus on feeling your baby move.   If your baby is still not moving like normal, you should call the office or go to [facility].  Call the office [phone]) or go to [facility] hospital for these signs of pre-eclampsia: Severe headache that does not go away with Tylenol Visual changes- seeing spots, double, blurred vision Pain under your right breast or upper abdomen that does not go away with Tums or heartburn medicine Nausea and/or vomiting Severe swelling in your hands, feet, and face   Tdap Vaccine It is recommended that you get the Tdap vaccine during the third trimester of EACH pregnancy to help protect your baby from  getting pertussis (whooping cough) 27-36 weeks is the BEST time to do this so that you can pass the protection on to your baby. During pregnancy is better than after pregnancy, but if you are unable to get it during pregnancy it will be offered at the hospital.  You can get this vaccine with [place], at the health department, your family doctor, or some local pharmacies Everyone who will be around your baby should also be up-to-date on their vaccines before the baby comes. Adults (who are not pregnant) only need 1 dose of Tdap during adulthood.   [facility] Pediatricians/Family Doctors [facility]): [address] Colette Ribas, [phone]           [facility]: [address], [phone]                [facility]): [address], [phone] (call to ask if accepting patients) [facility] Department: [address], [place], [phone]    [facility] Pediatricians/Family Doctors [facility]): [phone], [phone] [facility]: [address] [place], [phone] [facility] [place]: [address], [phone]  [facility] Doctors  [facility]): [phone] [facility]: [address], [phone]   [facility] Doctors [facility]: [address] [phone]  [facility] Doctors  [facility] [place]: [phone], [phone]  Home Blood Pressure Monitoring for  Patients   Your provider has recommended that you check your blood pressure (BP) at least once a week at home. If you do not have a blood pressure cuff at home, one will be provided for you. Contact your provider if you have not received your monitor within 1 week.   Helpful Tips for Accurate Home Blood Pressure Checks  Don't smoke, exercise, or drink caffeine 30 minutes before checking your BP Use  the restroom before checking your BP (a full bladder can raise your pressure) Relax in a comfortable upright chair Feet on the ground Left arm resting comfortably on a flat surface at the level of your heart Legs uncrossed Back supported Sit quietly and don't talk Place the cuff on your bare arm Adjust snuggly, so that only two fingertips can fit between your skin and the top of the cuff Check 2 readings separated by at least one minute Keep a log of your BP readings For a visual, please reference this diagram: http://ccnc.care/bpdiagram  Provider Name: [facility]     Phone: [phone]  Zone 1: ALL CLEAR  Continue to monitor your symptoms:  BP reading is less than 140 (top number) or less than 90 (bottom number)  No right upper stomach pain No headaches or seeing spots No feeling nauseated or throwing up No swelling in face and hands  Zone 2: CAUTION Call your doctor's office for any of the following:  BP reading is greater than 140 (top number) or greater than 90 (bottom number)  Stomach pain under your ribs in the middle or right side Headaches or seeing spots Feeling nauseated or throwing up Swelling in face and hands  Zone 3: EMERGENCY  Seek immediate medical care if you have any of the following:  BP reading is greater than160 (top number) or greater than 110 (bottom number) Severe headaches not improving with Tylenol Serious difficulty catching your breath Any worsening symptoms from Zone 2  Preterm Labor and Birth Information  The normal length of a pregnancy is 39-41 weeks. Preterm labor is when labor starts before 37 completed weeks of pregnancy. What are the risk factors for preterm labor? Preterm labor is more likely to occur in women who: Have certain infections during pregnancy such as a bladder infection, sexually transmitted infection, or infection inside the uterus (chorioamnionitis). Have a shorter-than-normal cervix. Have gone into preterm labor  before. Have had surgery on their cervix. Are younger than age 31 or older than age 1. Are African American. Are pregnant with twins or multiple babies (multiple gestation). Take street drugs or smoke while pregnant. Do not gain enough weight while pregnant. Became pregnant shortly after having been pregnant. What are the symptoms of preterm labor? Symptoms of preterm labor include: Cramps similar to those that can happen during a menstrual period. The cramps may happen with diarrhea. Pain in the abdomen or lower back. Regular uterine contractions that may feel like tightening of the abdomen. A feeling of increased pressure in the pelvis. Increased watery or bloody mucus discharge from the vagina. Water breaking (ruptured amniotic sac). Why is it important to recognize signs of preterm labor? It is important to recognize signs of preterm labor because babies who are born prematurely may not be fully developed. This can put them at an increased risk for: Long-term (chronic) heart and lung problems. Difficulty immediately after birth with regulating body systems, including blood sugar, body temperature, heart rate, and breathing rate. Bleeding in the brain. Cerebral palsy. Learning difficulties. Death. These  risks are highest for babies who are born before 34 weeks of pregnancy. How is preterm labor treated? Treatment depends on the length of your pregnancy, your condition, and the health of your baby. It may involve: Having a stitch (suture) placed in your cervix to prevent your cervix from opening too early (cerclage). Taking or being given medicines, such as: Hormone medicines. These may be given early in pregnancy to help support the pregnancy. Medicine to stop contractions. Medicines to help mature the baby's lungs. These may be prescribed if the risk of delivery is high. Medicines to prevent your baby from developing cerebral palsy. If the labor happens before 34 weeks of  pregnancy, you may need to stay in the hospital. What should I do if I think I am in preterm labor? If you think that you are going into preterm labor, call your health care provider right away. How can I prevent preterm labor in future pregnancies? To increase your chance of having a full-term pregnancy: Do not use any tobacco products, such as cigarettes, chewing tobacco, and e-cigarettes. If you need help quitting, ask your health care provider. Do not use street drugs or medicines that have not been prescribed to you during your pregnancy. Talk with your health care provider before taking any herbal supplements, even if you have been taking them regularly. Make sure you gain a healthy amount of weight during your pregnancy. Watch for infection. If you think that you might have an infection, get it checked right away. Make sure to tell your health care provider if you have gone into preterm labor before. This information is not intended to replace advice given to you by your health care provider. Make sure you discuss any questions you have with your health care provider. Document Revised: 05/25/2018 Document Reviewed: 06/24/2015 Elsevier Patient Education  2020 [employer].

## 2023-03-16 NOTE — Progress Notes (Signed)
LOW-RISK PREGNANCY VISIT Patient name: Rose Arnold MRN [id number]  Date of birth: [birth year] Chief Complaint:   Routine Prenatal Visit  History of Present Illness:   Rose T Holwerda is a 23 y.o. G71P1001 female at 110w2d with an Estimated Date of Delivery: 04/18/23 being seen today for ongoing management of a low-risk pregnancy.   Today she reports no complaints. Tried to call to get IV Venofer scheduled today, was told they didn't have her in system and would call her back tomorrow. Eating a lot of ice and chewing on corn starch and spitting it out.  Contractions: Irregular. Vag. Bleeding: None.  Movement: Present. denies leaking of fluid.     02/01/2023    9:04 AM 10/12/2022    2:30 PM 04/05/2021   10:53 AM 11/15/2019   10:40 AM 06/12/2019   11:46 AM  Depression screen PHQ 2/9  Decreased Interest 0 0 0 0 0  Down, Depressed, Hopeless 0 0 0 0 0  PHQ - 2 Score 0 0 0 0 0  Altered sleeping 0 0 0 0 1  Tired, decreased energy 0 0 0 0 1  Change in appetite 0 0 0 0 1  Feeling bad or failure about yourself  0 0 0 0 0  Trouble concentrating 0 0 0 0 0  Moving slowly or fidgety/restless 0 0 0 0 0  Suicidal thoughts  0 0 0 0  PHQ-9 Score 0 0 0 0 3  Difficult doing work/chores    Not difficult at all Not difficult at all        10/12/2022    2:30 PM 04/05/2021   10:53 AM 11/15/2019   10:41 AM 06/12/2019   11:46 AM  GAD 7 : Generalized Anxiety Score  Nervous, Anxious, on Edge 0 0 0 0  Control/stop worrying 0 0 0 0  Worry too much - different things 0 0 0 1  Trouble relaxing 0 0 0 0  Restless 0 0 0 0  Easily annoyed or irritable 0 0 0 0  Afraid - awful might happen 0 0 0 0  Total GAD 7 Score 0 0 0 1  Anxiety Difficulty   Not difficult at all Not difficult at all      Review of Systems:   Pertinent items are noted in HPI Denies abnormal vaginal discharge w/ itching/odor/irritation, headaches, visual changes, shortness of breath, chest pain, abdominal pain, severe nausea/vomiting, or  problems with urination or bowel movements unless otherwise stated above. Pertinent History Reviewed:  Reviewed past medical,surgical, social, obstetrical and family history.  Reviewed problem list, medications and allergies. Physical Assessment:   Vitals:   03/16/23 1611  BP: 112/68  Pulse: 97  Weight: 160 lb (72.6 kg)  Body mass index is 32.32 kg/m.        Physical Examination:   General appearance: Well appearing, and in no distress  Mental status: Alert, oriented to person, place, and time  Skin: Warm & dry  Cardiovascular: Normal heart rate noted  Respiratory: Normal respiratory effort, no distress  Abdomen: Soft, gravid, nontender  Pelvic: Cervical exam deferred         Extremities: Edema: None  Fetal Status: Fetal Heart Rate (bpm): 145 Fundal Height: 35 cm Movement: Present    Chaperone: N/A   No results found for this or any previous visit (from the past 24 hours).  Assessment & Plan:  1) Low-risk pregnancy G2P1001 at [redacted]w[redacted]d with an Estimated Date of Delivery: 04/18/23   2)  Anemia w/ PICA, hgb 8.4, taking po Fe every other day, IV venofer ordered previously, states she called today to schedule and was told they didn't have her in system and would call her back tomorrow. If doesn't hear from them tomorrow, to call them (number given). Note sent to [facility] to see if we need to do anything else on our end. Tish to f/u tomorrow.    Meds: No orders of the defined types were placed in this encounter.  Labs/procedures today: none  Plan:  Continue routine obstetrical care  Next visit: prefers will be in person for cultures     Reviewed: Preterm labor symptoms and general obstetric precautions including but not limited to vaginal bleeding, contractions, leaking of fluid and fetal movement were reviewed in detail with the patient.  All questions were answered. Does have home bp cuff. Office bp cuff given: not applicable. Check bp weekly, let us know if consistently >140  and/or >90.  Follow-up: Return for As scheduled.  Future Appointments  Date Time Provider Department Center  03/30/2023  4:10 PM Cheral Marker, [place] [facility]  04/06/2023 10:10 AM Cheral Marker, CNM [facility]  04/13/2023  4:10 PM Cheral Marker, CNM [facility]    No orders of the defined types were placed in this encounter.  Cheral Marker CNM, [facility] 03/16/2023 4:46 PM

## 2023-03-28 ENCOUNTER — Ambulatory Visit: Payer: Medicaid Other

## 2023-03-30 ENCOUNTER — Encounter: Payer: Medicaid Other | Admitting: Women's Health

## 2023-04-05 ENCOUNTER — Ambulatory Visit: Payer: Medicaid Other

## 2023-04-06 ENCOUNTER — Encounter: Payer: Medicaid Other | Attending: Advanced Practice Midwife | Admitting: Emergency Medicine

## 2023-04-06 ENCOUNTER — Other Ambulatory Visit: Payer: Self-pay | Admitting: Advanced Practice Midwife

## 2023-04-06 ENCOUNTER — Other Ambulatory Visit (HOSPITAL_COMMUNITY)
Admission: RE | Admit: 2023-04-06 | Discharge: 2023-04-06 | Disposition: A | Discharge status: Home / Self Care | Payer: Medicaid Other | Source: Ambulatory Visit | Attending: Women's Health | Admitting: Women's Health

## 2023-04-06 ENCOUNTER — Ambulatory Visit (INDEPENDENT_AMBULATORY_CARE_PROVIDER_SITE_OTHER): Payer: Medicaid Other | Admitting: Advanced Practice Midwife

## 2023-04-06 ENCOUNTER — Encounter: Payer: Self-pay | Admitting: Advanced Practice Midwife

## 2023-04-06 VITALS — BP 116/78 | HR 102 | Wt 163.0 lb

## 2023-04-06 VITALS — BP 109/72 | HR 89 | Temp 98.4°F | Resp 18

## 2023-04-06 DIAGNOSIS — Z362 Encounter for other antenatal screening follow-up: Secondary | ICD-10-CM | POA: Diagnosis not present

## 2023-04-06 DIAGNOSIS — Z3A38 38 weeks gestation of pregnancy: Secondary | ICD-10-CM | POA: Diagnosis not present

## 2023-04-06 DIAGNOSIS — O99013 Anemia complicating pregnancy, third trimester: Secondary | ICD-10-CM

## 2023-04-06 DIAGNOSIS — O321XX0 Maternal care for breech presentation, not applicable or unspecified: Secondary | ICD-10-CM

## 2023-04-06 DIAGNOSIS — D500 Iron deficiency anemia secondary to blood loss (chronic): Secondary | ICD-10-CM | POA: Insufficient documentation

## 2023-04-06 LAB — POCT HEMOGLOBIN: Hemoglobin: 7 g/dL — AB (ref 11–14.6)

## 2023-04-06 MED ORDER — IRON SUCROSE 300 MG IVPB - SIMPLE MED
300.0000 mg | Freq: Once | Status: AC
  Administered 2023-04-06: 300 mg via INTRAVENOUS
  Filled 2023-04-06: qty 300

## 2023-04-06 MED ORDER — DIPHENHYDRAMINE HCL 25 MG PO CAPS
25.0000 mg | ORAL_CAPSULE | Freq: Once | ORAL | Status: AC
  Administered 2023-04-06: 25 mg via ORAL

## 2023-04-06 MED ORDER — ACETAMINOPHEN 325 MG PO TABS
650.0000 mg | ORAL_TABLET | Freq: Once | ORAL | Status: AC
  Administered 2023-04-06: 650 mg via ORAL

## 2023-04-06 NOTE — Patient Instructions (Signed)
[place],   Your version is scheduled for 8am--come to Maternity Admissions Unit at [facility] around 7:45 . Don't have anything to eat or drink in the morning (other than water/clear liquids).  Let me know if you have any questions.  Drenda Freeze

## 2023-04-06 NOTE — Progress Notes (Signed)
Diagnosis: Iron Deficiency Anemia  Provider:  Cathie Beams CNM  Procedure: IV Infusion  IV Type: Peripheral, IV Location: L Hand  Venofer (Iron Sucrose), Dose: 300 mg  Infusion Start Time: 1238  Infusion Stop Time: 1421  Post Infusion IV Care: Observation period completed and Peripheral IV Discontinued  Discharge: Condition: Good, Destination: Home . AVS Provided  Performed by:  [name], RN

## 2023-04-06 NOTE — Progress Notes (Signed)
   LOW-RISK PREGNANCY VISIT Patient name: Rose Arnold MRN [id number]  Date of birth: 06-Nov-2000 Chief Complaint:   Routine Prenatal Visit (Bright red bleeding after intercourse Monday stopped yesterday)  History of Present Illness:   Rose Arnold is a 23 y.o. G79P1001 female at [redacted]w[redacted]d with an Estimated Date of Delivery: 04/18/23 being seen today for ongoing management of a low-risk pregnancy.  Today she reports some bleeding after IC (was 'rougher than usual").  None today. Contractions: Not present. Vag. Bleeding: None.  Movement: Present. denies leaking of fluid. Review of Systems:   Pertinent items are noted in HPI Denies abnormal vaginal discharge w/ itching/odor/irritation, headaches, visual changes, shortness of breath, chest pain, abdominal pain, severe nausea/vomiting, or problems with urination or bowel movements unless otherwise stated above. Pertinent History Reviewed:  Reviewed past medical,surgical, social, obstetrical and family history.  Reviewed problem list, medications and allergies. Physical Assessment:   Vitals:   04/06/23 1022  BP: 116/78  Pulse: (!) 102  Weight: 163 lb (73.9 kg)  Body mass index is 32.92 kg/m.        Physical Examination:   General appearance: Well appearing, and in no distress  Mental status: Alert, oriented to person, place, and time  Skin: Warm & dry  Cardiovascular: Normal heart rate noted  Respiratory: Normal respiratory effort, no distress  Abdomen: Soft, gravid, nontender  Pelvic: Cervical exam performed  Dilation: 1 Effacement (%): Thick Station: Ballotable No bleeding  Extremities: Edema: None Chaperone:  Peggy Dones   Fetal Status: Fetal Heart Rate (bpm): 150 Fundal Height: 37 cm Movement: Present Presentation: Homero Fellers Breech     Results for orders placed or performed in visit on 04/06/23 (from the past 24 hours)  POCT hemoglobin   Collection Time: 04/06/23 10:25 AM  Result Value Ref Range   Hemoglobin 7 (A) 11 - 14.6 g/dL     Assessment & Plan:    Pregnancy: G2P1001 at [redacted]w[redacted]d 1. Anemia during pregnancy in third trimester (Primary) Can go now to [facility] for Fe infusion, but only for 300mg  (closing early d/t weather); will do 500mg  dose next week - POCT hemoglobin  2. Encounter for other antenatal screening follow-up   3. [redacted] weeks gestation of pregnancy BREECH;  BS US shows breech, Dr. Despina Hidden in to assess.  Feels like ECV would be quite easy, recommends scheduling tomorrow     Meds: No orders of the defined types were placed in this encounter.  Labs/procedures today:  Orders Placed This Encounter  Procedures   Culture, beta strep (group b only)   POCT hemoglobin     Plan:  Continue routine obstetrical care  Next visit: prefers in person    Reviewed: Term labor symptoms and general obstetric precautions including but not limited to vaginal bleeding, contractions, leaking of fluid and fetal movement were reviewed in detail with the patient.  All questions were answered. Has home bp cuff. . Check bp weekly, let us know if >140/90.   Follow-up: No follow-ups on file.  Future Appointments  Date Time Provider Department Center  04/06/2023 12:00 PM APINF-CHAIR 1 [facility]INFCTR None  04/07/2023  8:00 AM [facility]-LD SCHED ROOM [facility]INDC None  04/11/2023  9:00 AM APINF-CHAIR 1 [facility]-INFCTR None  04/13/2023  4:10 PM Cheral Marker, CNM [facility]    Orders Placed This Encounter  Procedures   Culture, beta strep (group b only)   POCT hemoglobin   Jacklyn Shell DNP, CNM 04/06/2023 11:58 AM

## 2023-04-06 NOTE — Progress Notes (Signed)
error 

## 2023-04-07 ENCOUNTER — Observation Stay (HOSPITAL_COMMUNITY): Payer: Medicaid Other

## 2023-04-07 ENCOUNTER — Encounter (HOSPITAL_COMMUNITY): Payer: Self-pay | Admitting: Anesthesiology

## 2023-04-07 ENCOUNTER — Encounter (HOSPITAL_COMMUNITY): Payer: Self-pay | Admitting: *Deleted

## 2023-04-07 ENCOUNTER — Ambulatory Visit (HOSPITAL_COMMUNITY)
Admission: RE | Admit: 2023-04-07 | Discharge: 2023-04-07 | Disposition: A | Discharge status: Home / Self Care | Payer: Medicaid Other | Attending: Obstetrics and Gynecology | Admitting: Obstetrics and Gynecology

## 2023-04-07 ENCOUNTER — Telehealth (HOSPITAL_COMMUNITY): Payer: Self-pay | Admitting: *Deleted

## 2023-04-07 DIAGNOSIS — O321XX0 Maternal care for breech presentation, not applicable or unspecified: Principal | ICD-10-CM | POA: Diagnosis present

## 2023-04-07 LAB — CERVICOVAGINAL ANCILLARY ONLY
Chlamydia: NEGATIVE
Comment: NEGATIVE
Comment: NORMAL
Neisseria Gonorrhea: NEGATIVE

## 2023-04-07 NOTE — Anesthesia Preprocedure Evaluation (Deleted)
Anesthesia Evaluation    Reviewed: Allergy & Precautions, Patient's Chart, lab work & pertinent test results  History of Anesthesia Complications Negative for: history of anesthetic complications  Airway        Dental   Pulmonary asthma           Cardiovascular negative cardio ROS      Neuro/Psych negative neurological ROS  negative psych ROS   GI/Hepatic negative GI ROS, Neg liver ROS,,,  Endo/Other   Pre-DM   Renal/GU negative Renal ROS     Musculoskeletal negative musculoskeletal ROS (+)    Abdominal   Peds  Hematology  (+) Blood dyscrasia, anemia   Anesthesia Other Findings   Reproductive/Obstetrics (+) Pregnancy                             Anesthesia Physical Anesthesia Plan Anesthesia Quick Evaluation

## 2023-04-07 NOTE — Telephone Encounter (Signed)
 Preadmission screen

## 2023-04-07 NOTE — Discharge Summary (Signed)
No charge note, entering note because told need to in order to discharge patient, she ate today, offered version this afternoon or tomorrow, patient requests Sunday so has been scheduled then.

## 2023-04-07 NOTE — Progress Notes (Deleted)
No charge note, entering note because told need to in order to discharge patient, she ate today, offered version this afternoon or tomorrow, patient requests Sunday so has been scheduled then.

## 2023-04-07 NOTE — Progress Notes (Signed)
Pt arrived stated she ate bowl of cereal this morning not able to perform version per anesthesia and dr Ashok Pall. [place] pt still breech pt given option to come back today pt rescheduled for Sunday morning . Instructed pt not to eat 8 hours prior to procedure

## 2023-04-09 ENCOUNTER — Other Ambulatory Visit: Payer: Self-pay | Admitting: Obstetrics and Gynecology

## 2023-04-09 ENCOUNTER — Observation Stay (HOSPITAL_COMMUNITY): Payer: Medicaid Other

## 2023-04-09 ENCOUNTER — Observation Stay (HOSPITAL_COMMUNITY)
Admission: RE | Admit: 2023-04-09 | Discharge: 2023-04-09 | Disposition: A | Discharge status: Home / Self Care | Payer: Medicaid Other | Attending: Obstetrics & Gynecology | Admitting: Obstetrics & Gynecology

## 2023-04-09 DIAGNOSIS — Z3A38 38 weeks gestation of pregnancy: Secondary | ICD-10-CM | POA: Insufficient documentation

## 2023-04-09 DIAGNOSIS — J45909 Unspecified asthma, uncomplicated: Secondary | ICD-10-CM | POA: Diagnosis not present

## 2023-04-09 DIAGNOSIS — O99513 Diseases of the respiratory system complicating pregnancy, third trimester: Secondary | ICD-10-CM | POA: Insufficient documentation

## 2023-04-09 DIAGNOSIS — O321XX0 Maternal care for breech presentation, not applicable or unspecified: Secondary | ICD-10-CM

## 2023-04-09 DIAGNOSIS — O329XX0 Maternal care for malpresentation of fetus, unspecified, not applicable or unspecified: Secondary | ICD-10-CM | POA: Insufficient documentation

## 2023-04-09 MED ORDER — TERBUTALINE SULFATE 1 MG/ML IJ SOLN: 0.2500 mg | INTRAMUSCULAR | Status: DC

## 2023-04-09 MED ORDER — TERBUTALINE SULFATE 1 MG/ML IJ SOLN
INTRAMUSCULAR | Status: AC
  Filled 2023-04-09: qty 1

## 2023-04-09 MED ORDER — TERBUTALINE SULFATE 1 MG/ML IJ SOLN
0.2500 mg | Freq: Once | INTRAMUSCULAR | Status: AC
  Administered 2023-04-09: 0.25 mg via SUBCUTANEOUS

## 2023-04-09 MED ORDER — LACTATED RINGERS IV SOLN: INTRAVENOUS | Status: DC

## 2023-04-09 MED ORDER — SOD CITRATE-CITRIC ACID 500-334 MG/5ML PO SOLN
30.0000 mL | ORAL | Status: DC
Start: 2023-04-09 — End: 2023-04-09

## 2023-04-09 MED ORDER — ONDANSETRON HCL 4 MG/2ML IJ SOLN: 4.0000 mg | Freq: Four times a day (QID) | INTRAMUSCULAR | Status: DC | PRN

## 2023-04-09 MED ORDER — LACTATED RINGERS IV SOLN: 500.0000 mL | INTRAVENOUS | Status: DC | PRN

## 2023-04-09 NOTE — Progress Notes (Signed)
 2nd reviewer RN, Barbaraann Share.

## 2023-04-09 NOTE — Progress Notes (Signed)
  Arrived for version.

## 2023-04-09 NOTE — Progress Notes (Signed)
 After informed verbal consent, Terbutaline 0.25 mg SQ given, ECV was attempted under Ultrasound guidance.  Breech, converted after ECV confirmed by [place].   FHR was reactive before and after the procedure.   Pt. Tolerated the procedure well.  Adam Phenix, MD 04/09/2023  9:23 AM Patient ID: Rose Arnold, female   DOB: 04-14-2000, 23 y.o.   MRN: [id number]

## 2023-04-09 NOTE — Discharge Summary (Signed)
 Patient ID: [place] T Manna MRN: [id number] DOB/AGE: 2000-03-27 23 y.o.  Admit date: 04/09/2023 Discharge date: 04/09/2023  Admission Diagnoses:breech presentation  Discharge Diagnoses: cephalic presentation after ECV  Prenatal Procedures: NST, ultrasound, and ECV   Hospital Course:  This is a 23 y.o. G2P1001 with IUP at [redacted]w[redacted]d admitted for ECV. She was deemed stable for discharge to home with outpatient follow up.  Discharge Exam: Temp:  [98.3 F (36.8 C)] 98.3 F (36.8 C) (02/23 0824) Resp:  [20] 20 (02/23 0824) BP: (106)/(67) 106/67 (02/23 0810) Weight:  [72.3 kg] 72.3 kg (02/23 0803) Physical Examination: CONSTITUTIONAL: Well-developed, well-nourished female in no acute distress.  HENT:  Normocephalic, atraumatic, External right and left ear normal. Oropharynx is clear and moist EYES: Conjunctivae and EOM are normal. Pupils are equal, round, and reactive to light. No scleral icterus.  NECK: Normal range of motion, supple, no masses SKIN: Skin is warm and dry. No rash noted. Not diaphoretic. No erythema. No pallor. NEUROLGIC: Alert and oriented to person, place, and time. Normal reflexes, muscle tone coordination. No cranial nerve deficit noted. PSYCHIATRIC: Normal mood and affect. Normal behavior. Normal judgment and thought content. CARDIOVASCULAR: Normal heart rate noted, regular rhythm RESPIRATORY: Effort and breath sounds normal, no problems with respiration noted MUSCULOSKELETAL: Normal range of motion. No edema and no tenderness. 2+ distal pulses. ABDOMEN: Soft, nontender, nondistended, gravid.   Fetal monitoring: FHR: 140 bpm, Variability: moderate, Accelerations: Present, Decelerations: Absent  Uterine activity: 0 contractions per hour  Significant Diagnostic Studies:  Results for orders placed or performed in visit on 04/06/23 (from the past week)  POCT hemoglobin   Collection Time: 04/06/23 10:25 AM  Result Value Ref Range   Hemoglobin 7 (A) 11 - 14.6 g/dL   Cervicovaginal ancillary only   Collection Time: 04/06/23 11:13 AM  Result Value Ref Range   Neisseria Gonorrhea Negative    Chlamydia Negative    Comment Normal Reference Ranger Chlamydia - Negative    Comment      Normal Reference Range Neisseria Gonorrhea - Negative  Culture, beta strep (group b only)   Collection Time: 04/06/23  4:30 PM   Specimen: Vaginal/Rectal; Genital   VR  Result Value Ref Range   Strep Gp B Culture Comment Negative    Discharge Condition: Stable  Disposition:  Discharge disposition: 01-Home or Self Care        Discharge Instructions     Discharge patient   Complete by: As directed    Discharge to home as per PACU criteria   Discharge disposition: 01-Home or Self Care   Discharge patient date: 04/09/2023      Allergies as of 04/09/2023   No Known Allergies      Medication List     TAKE these medications    Blood Pressure Monitor Misc For regular home bp monitoring during pregnancy   ferrous sulfate 325 (65 FE) MG EC tablet Take 1 tablet (325 mg total) by mouth every other day.   Prenatal Vitamins 28-0.8 MG Tabs Take by mouth.        Follow-up Information     [facility] for [facility] at [facility] Follow up in 3 day(s).   Specialty: Obstetrics and Gynecology Contact information: [address] [place] [address] [phone]                Signed: Scheryl Darter M.D. 04/09/2023, 9:17 AM

## 2023-04-09 NOTE — H&P (Signed)
 Rose Arnold is a 23 y.o. female presenting for ECV for breech. G2P1001 [redacted]w[redacted]d  OB History     Gravida  2   Para  1   Term  1   Preterm  0   AB  0   Living  1      SAB  0   IAB  0   Ectopic  0   Multiple  0   Live Births  1          Past Medical History:  Diagnosis Date   Allergy    Seasonal   Asthma    as a child   Gonorrhea 03/13/2019   Treated 03/13/19 with rocephin and azithromycin:   POC__________   Pre-diabetes    Past Surgical History:  Procedure Laterality Date   NO PAST SURGERIES     Family History: family history includes Asthma in her paternal aunt and paternal grandfather; COPD in her maternal grandfather; Depression in her maternal grandmother; Healthy in her mother; Hypertension in her maternal grandmother. Social History:  reports that she has never smoked. She has never used smokeless tobacco. She reports that she does not currently use alcohol. She reports that she does not use drugs.     Maternal Diabetes: No Genetic Screening: Normal Maternal Ultrasounds/Referrals: Normal Fetal Ultrasounds or other Referrals:  None Maternal Substance Abuse:  No Significant Maternal Medications:  None Significant Maternal Lab Results:  Other:  Number of Prenatal Visits:greater than 3 verified prenatal visits Maternal Vaccinations: Other Comments:  None  Review of Systems  All other systems reviewed and are negative.  Maternal Medical History:  Fetal activity: Perceived fetal activity is normal.   Prenatal Complications - Diabetes: none.     Blood pressure 106/67, temperature 98.3 F (36.8 C), temperature source Oral, resp. rate 20, height 4\' 11"  (1.499 m), weight 72.3 kg, last menstrual period 07/12/2022. Maternal Exam:  Abdomen: Patient reports no abdominal tenderness. Introitus: not evaluated.   Cervix: not evaluated.   Physical Exam Vitals and nursing note reviewed. Exam conducted with a chaperone present.  Constitutional:       Appearance: Normal appearance. She is not ill-appearing.  HENT:     Head: Normocephalic and atraumatic.  Cardiovascular:     Rate and Rhythm: Normal rate.  Pulmonary:     Effort: Pulmonary effort is normal.  Musculoskeletal:     Cervical back: Normal range of motion.  Neurological:     Mental Status: She is alert.  Psychiatric:        Mood and Affect: Mood normal.        Behavior: Behavior normal.     Prenatal labs: ABO, Rh: O/Positive/-- (08/28 1515) Antibody: Negative (12/18 0825) Rubella: 1.05 (08/28 1515) RPR: Non Reactive (12/18 0825)  HBsAg: Negative (08/28 1515)  HIV: Non Reactive (12/18 0825)  GBS: Comment/-- (02/20 1630)   Assessment/Plan: Breech presentation for ECV Risks and benefits discussed including ROM, labor, fetal or maternal intolerance, emergency cesarean section, failure of version.  Scheryl Darter 04/09/2023, 8:39 AM

## 2023-04-10 LAB — CULTURE, BETA STREP (GROUP B ONLY): Strep Gp B Culture: NEGATIVE

## 2023-04-11 ENCOUNTER — Ambulatory Visit: Payer: Medicaid Other

## 2023-04-13 ENCOUNTER — Ambulatory Visit: Payer: Medicaid Other | Admitting: Women's Health

## 2023-04-13 ENCOUNTER — Ambulatory Visit: Payer: Medicaid Other

## 2023-04-13 ENCOUNTER — Encounter: Payer: Self-pay | Admitting: Women's Health

## 2023-04-13 VITALS — BP 116/76 | HR 99 | Wt 157.0 lb

## 2023-04-13 DIAGNOSIS — Z3483 Encounter for supervision of other normal pregnancy, third trimester: Secondary | ICD-10-CM | POA: Diagnosis not present

## 2023-04-13 DIAGNOSIS — Z3A39 39 weeks gestation of pregnancy: Secondary | ICD-10-CM | POA: Diagnosis not present

## 2023-04-13 MED ORDER — SODIUM CHLORIDE 0.9 % IV SOLN
500.0000 mg | Freq: Once | INTRAVENOUS | Status: AC
Start: 2023-04-13 — End: ?
  Filled 2023-04-13: qty 25

## 2023-04-13 MED ORDER — DIPHENHYDRAMINE HCL 25 MG PO CAPS
25.0000 mg | ORAL_CAPSULE | Freq: Once | ORAL | Status: AC
Start: 2023-04-13 — End: ?

## 2023-04-13 MED ORDER — ACETAMINOPHEN 325 MG PO TABS: 650.0000 mg | ORAL_TABLET | Freq: Once | ORAL | Status: AC

## 2023-04-13 NOTE — Patient Instructions (Signed)
 [place], thank you for choosing our office today! We appreciate the opportunity to meet your healthcare needs. You may receive a short survey by mail, e-mail, or through [employer]. If you are happy with your care we would appreciate if you could take just a few minutes to complete the survey questions. We read all of your comments and take your feedback very seriously. Thank you again for choosing our office.  Center for [employer] Team at [facility]  [facility] at [facility] ([address] [place], [place] [address]) Entrance C, located off of [address] Free 24/7 valet parking   CLASSES: Go to [email or web address] to register for classes (childbirth, breastfeeding, waterbirth, infant CPR, daddy bootcamp, etc.)  Call the office [phone]) or go to [facility] if: You begin to have strong, frequent contractions Your water breaks.  Sometimes it is a big gush of fluid, sometimes it is just a trickle that keeps getting your panties wet or running down your legs You have vaginal bleeding.  It is normal to have a small amount of spotting if your cervix was checked.  You don't feel your baby moving like normal.  If you don't, get you something to eat and drink and lay down and focus on feeling your baby move.   If your baby is still not moving like normal, you should call the office or go to [facility].  Call the office [phone]) or go to [facility] hospital for these signs of pre-eclampsia: Severe headache that does not go away with Tylenol Visual changes- seeing spots, double, blurred vision Pain under your right breast or upper abdomen that does not go away with Tums or heartburn medicine Nausea and/or vomiting Severe swelling in your hands, feet, and face   [facility] Pediatricians/Family Doctors [facility]): [address] Colette Ribas, [phone]           [facility]: [address], [phone]                 [facility]): [address], [phone] (call to ask if accepting patients) [facility] Department: [address] [place], [phone]    [facility] Pediatricians/Family Doctors [facility]): [address], [phone] [facility]: [address] [place], [phone] [facility]: [address], [phone]  [facility] Doctors  [facility]): [phone] [facility]: [address] [phone]   [facility] Doctors [facility]: [address] [phone]  [facility] Doctors  [facility]: [phone], [phone]  Home Blood Pressure Monitoring for Patients   Your provider has recommended that you check your blood pressure (BP) at least once a week at home. If you do not have a blood pressure cuff at home, one will be provided for you. Contact your provider if you have not received your monitor within 1 week.   Helpful Tips for Accurate Home Blood Pressure Checks  Don't smoke, exercise, or drink caffeine 30 minutes before checking your BP Use the restroom before checking your BP (a full bladder can raise your pressure) Relax in a comfortable upright chair Feet on the ground Left arm resting comfortably on a flat surface at the level of your heart Legs uncrossed Back supported Sit quietly and don't talk Place the cuff on your bare arm Adjust snuggly, so that only two fingertips  can fit between your skin and the top of the cuff Check 2 readings separated by at least one minute Keep a log of your BP readings For a visual, please reference this diagram: http://ccnc.care/bpdiagram  Provider Name: [facility]     Phone: [phone]  Zone 1: ALL CLEAR  Continue to monitor your symptoms:  BP reading is less than 140 (top number) or less than 90 (bottom number)  No right  upper stomach pain No headaches or seeing spots No feeling nauseated or throwing up No swelling in face and hands  Zone 2: CAUTION Call your doctor's office for any of the following:  BP reading is greater than 140 (top number) or greater than 90 (bottom number)  Stomach pain under your ribs in the middle or right side Headaches or seeing spots Feeling nauseated or throwing up Swelling in face and hands  Zone 3: EMERGENCY  Seek immediate medical care if you have any of the following:  BP reading is greater than160 (top number) or greater than 110 (bottom number) Severe headaches not improving with Tylenol Serious difficulty catching your breath Any worsening symptoms from Zone 2   Braxton Hicks Contractions Contractions of the uterus can occur throughout pregnancy, but they are not always a sign that you are in labor. You may have practice contractions called Braxton Hicks contractions. These false labor contractions are sometimes confused with true labor. What are [place] contractions? Braxton Hicks contractions are tightening movements that occur in the muscles of the uterus before labor. Unlike true labor contractions, these contractions do not result in opening (dilation) and thinning of the cervix. Toward the end of pregnancy (32-34 weeks), Braxton Hicks contractions can happen more often and may become stronger. These contractions are sometimes difficult to tell apart from true labor because they can be very uncomfortable. You should not feel embarrassed if you go to the hospital with false labor. Sometimes, the only way to tell if you are in true labor is for your health care provider to look for changes in the cervix. The health care provider will do a physical exam and may monitor your contractions. If you are not in true labor, the exam should show that your cervix is not dilating and your water has not broken. If there are no other health problems associated with your  pregnancy, it is completely safe for you to be sent home with false labor. You may continue to have Braxton Hicks contractions until you go into true labor. How to tell the difference between true labor and false labor True labor Contractions last 30-70 seconds. Contractions become very regular. Discomfort is usually felt in the top of the uterus, and it spreads to the lower abdomen and low back. Contractions do not go away with walking. Contractions usually become more intense and increase in frequency. The cervix dilates and gets thinner. False labor Contractions are usually shorter and not as strong as true labor contractions. Contractions are usually irregular. Contractions are often felt in the front of the lower abdomen and in the groin. Contractions may go away when you walk around or change positions while lying down. Contractions get weaker and are shorter-lasting as time goes on. The cervix usually does not dilate or become thin. Follow these instructions at home:  Take over-the-counter and prescription medicines only as told by your health care provider. Keep up with your usual exercises and follow other instructions from your health care provider. Eat and drink lightly if you think  you are going into labor. If Braxton Hicks contractions are making you uncomfortable: Change your position from lying down or resting to walking, or change from walking to resting. Sit and rest in a tub of warm water. Drink enough fluid to keep your urine pale yellow. Dehydration may cause these contractions. Do slow and deep breathing several times an hour. Keep all follow-up prenatal visits as told by your health care provider. This is important. Contact a health care provider if: You have a fever. You have continuous pain in your abdomen. Get help right away if: Your contractions become stronger, more regular, and closer together. You have fluid leaking or gushing from your vagina. You pass  blood-tinged mucus (bloody show). You have bleeding from your vagina. You have low back pain that you never had before. You feel your baby's head pushing down and causing pelvic pressure. Your baby is not moving inside you as much as it used to. Summary Contractions that occur before labor are called Braxton Hicks contractions, false labor, or practice contractions. Braxton Hicks contractions are usually shorter, weaker, farther apart, and less regular than true labor contractions. True labor contractions usually become progressively stronger and regular, and they become more frequent. Manage discomfort from [facility] contractions by changing position, resting in a warm bath, drinking plenty of water, or practicing deep breathing. This information is not intended to replace advice given to you by your health care provider. Make sure you discuss any questions you have with your health care provider. Document Revised: 01/13/2017 Document Reviewed: 06/16/2016 Elsevier Patient Education  [place].

## 2023-04-13 NOTE — Progress Notes (Signed)
 LOW-RISK PREGNANCY VISIT Patient name: Rose Arnold MRN [id number]  Date of birth: 2000/09/09 Chief Complaint:   Routine Prenatal Visit  History of Present Illness:   Rose Arnold is a 23 y.o. G59P1001 female at [redacted]w[redacted]d with an Estimated Date of Delivery: 04/18/23 being seen today for ongoing management of a low-risk pregnancy.   Today she reports no complaints. Contractions: Irritability. Vag. Bleeding: None.  Movement: Present. denies leaking of fluid.     04/06/2023   12:09 PM 02/01/2023    9:04 AM 10/12/2022    2:30 PM 04/05/2021   10:53 AM 11/15/2019   10:40 AM  Depression screen PHQ 2/9  Decreased Interest 0 0 0 0 0  Down, Depressed, Hopeless 0 0 0 0 0  PHQ - 2 Score 0 0 0 0 0  Altered sleeping  0 0 0 0  Tired, decreased energy  0 0 0 0  Change in appetite  0 0 0 0  Feeling bad or failure about yourself   0 0 0 0  Trouble concentrating  0 0 0 0  Moving slowly or fidgety/restless  0 0 0 0  Suicidal thoughts   0 0 0  PHQ-9 Score  0 0 0 0  Difficult doing work/chores     Not difficult at all        10/12/2022    2:30 PM 04/05/2021   10:53 AM 11/15/2019   10:41 AM 06/12/2019   11:46 AM  GAD 7 : Generalized Anxiety Score  Nervous, Anxious, on Edge 0 0 0 0  Control/stop worrying 0 0 0 0  Worry too much - different things 0 0 0 1  Trouble relaxing 0 0 0 0  Restless 0 0 0 0  Easily annoyed or irritable 0 0 0 0  Afraid - awful might happen 0 0 0 0  Total GAD 7 Score 0 0 0 1  Anxiety Difficulty   Not difficult at all Not difficult at all      Review of Systems:   Pertinent items are noted in HPI Denies abnormal vaginal discharge w/ itching/odor/irritation, headaches, visual changes, shortness of breath, chest pain, abdominal pain, severe nausea/vomiting, or problems with urination or bowel movements unless otherwise stated above. Pertinent History Reviewed:  Reviewed past medical,surgical, social, obstetrical and family history.  Reviewed problem list, medications and  allergies. Physical Assessment:   Vitals:   04/13/23 1622  BP: 116/76  Pulse: 99  Weight: 157 lb (71.2 kg)  Body mass index is 31.71 kg/m.        Physical Examination:   General appearance: Well appearing, and in no distress  Mental status: Alert, oriented to person, place, and time  Skin: Warm & dry  Cardiovascular: Normal heart rate noted  Respiratory: Normal respiratory effort, no distress  Abdomen: Soft, gravid, nontender  Pelvic: Cervical exam performed  Dilation: 3 Effacement (%): 50 Station: -3, Offered membrane sweeping, discussed r/b- pt decided to proceed, so membranes swept.   Extremities: Edema: None  Fetal Status: Fetal Heart Rate (bpm): 152 Fundal Height: 37 cm Movement: Present Presentation: Vertex  Chaperone: Latisha Cresenzo No results found for this or any previous visit (from the past 24 hours).  Assessment & Plan:  1) Low-risk pregnancy G2P1001 at [redacted]w[redacted]d with an Estimated Date of Delivery: 04/18/23   2) Anemia w/ PICA, s/p 1 IV Venofer, has appt tomorrow for next  3) Recent successful ECV> still vtx!   Meds: No orders of the defined types  were placed in this encounter.  Labs/procedures today: SVE and membrane sweep  Plan:  Continue routine obstetrical care  Next visit: prefers will be in person for postdates nst     Reviewed: Term labor symptoms and general obstetric precautions including but not limited to vaginal bleeding, contractions, leaking of fluid and fetal movement were reviewed in detail with the patient.  All questions were answered. Does have home bp cuff. Office bp cuff given: not applicable. Check bp weekly, let us know if consistently >140 and/or >90.  Follow-up: Return in about 1 week (around 04/20/2023) for LROB, NST, CNM, in person.  Future Appointments  Date Time Provider Department Center  04/14/2023  8:00 AM [facility]CHAIR 1 [facility] None    No orders of the defined types were placed in this encounter.  Cheral Marker CNM,  [facility] 04/13/2023 4:36 PM

## 2023-04-14 ENCOUNTER — Inpatient Hospital Stay (HOSPITAL_COMMUNITY)
Admission: AD | Admit: 2023-04-14 | Discharge: 2023-04-17 | DRG: 807 | Disposition: A | Discharge status: Home / Self Care | Payer: Medicaid Other | Attending: Family Medicine | Admitting: Family Medicine

## 2023-04-14 ENCOUNTER — Encounter (HOSPITAL_COMMUNITY): Payer: Self-pay | Admitting: Family Medicine

## 2023-04-14 VITALS — BP 110/70 | HR 95 | Temp 98.5°F | Resp 16

## 2023-04-14 DIAGNOSIS — Z8249 Family history of ischemic heart disease and other diseases of the circulatory system: Secondary | ICD-10-CM

## 2023-04-14 DIAGNOSIS — O368390 Maternal care for abnormalities of the fetal heart rate or rhythm, unspecified trimester, not applicable or unspecified: Secondary | ICD-10-CM | POA: Diagnosis present

## 2023-04-14 DIAGNOSIS — Z5982 Transportation insecurity: Secondary | ICD-10-CM

## 2023-04-14 DIAGNOSIS — Z3A39 39 weeks gestation of pregnancy: Secondary | ICD-10-CM

## 2023-04-14 DIAGNOSIS — O9952 Diseases of the respiratory system complicating childbirth: Secondary | ICD-10-CM | POA: Diagnosis present

## 2023-04-14 DIAGNOSIS — O2300 Infections of kidney in pregnancy, unspecified trimester: Secondary | ICD-10-CM | POA: Diagnosis present

## 2023-04-14 DIAGNOSIS — O99013 Anemia complicating pregnancy, third trimester: Secondary | ICD-10-CM

## 2023-04-14 DIAGNOSIS — Z3490 Encounter for supervision of normal pregnancy, unspecified, unspecified trimester: Secondary | ICD-10-CM

## 2023-04-14 DIAGNOSIS — Z560 Unemployment, unspecified: Secondary | ICD-10-CM

## 2023-04-14 DIAGNOSIS — O9902 Anemia complicating childbirth: Secondary | ICD-10-CM | POA: Diagnosis present

## 2023-04-14 DIAGNOSIS — O99019 Anemia complicating pregnancy, unspecified trimester: Secondary | ICD-10-CM | POA: Diagnosis present

## 2023-04-14 DIAGNOSIS — O326XX0 Maternal care for compound presentation, not applicable or unspecified: Secondary | ICD-10-CM | POA: Diagnosis present

## 2023-04-14 DIAGNOSIS — J45909 Unspecified asthma, uncomplicated: Secondary | ICD-10-CM | POA: Diagnosis present

## 2023-04-14 DIAGNOSIS — O134 Gestational [pregnancy-induced] hypertension without significant proteinuria, complicating childbirth: Principal | ICD-10-CM | POA: Diagnosis present

## 2023-04-14 DIAGNOSIS — Z3480 Encounter for supervision of other normal pregnancy, unspecified trimester: Principal | ICD-10-CM

## 2023-04-14 DIAGNOSIS — O2302 Infections of kidney in pregnancy, second trimester: Secondary | ICD-10-CM

## 2023-04-14 MED ORDER — ACETAMINOPHEN 325 MG PO TABS
650.0000 mg | ORAL_TABLET | Freq: Once | ORAL | Status: AC
  Administered 2023-04-14: 650 mg via ORAL

## 2023-04-14 MED ORDER — DIPHENHYDRAMINE HCL 25 MG PO CAPS
25.0000 mg | ORAL_CAPSULE | Freq: Once | ORAL | Status: AC
Start: 2023-04-14 — End: 2023-04-14
  Administered 2023-04-14: 25 mg via ORAL

## 2023-04-14 MED ORDER — SODIUM CHLORIDE 0.9 % IV SOLN
500.0000 mg | Freq: Once | INTRAVENOUS | Status: AC
  Administered 2023-04-14: 500 mg via INTRAVENOUS
  Filled 2023-04-14: qty 25

## 2023-04-14 NOTE — MAU Note (Signed)
 Pt says UC's strong since 1030pm Vertex- after a version on Sunday  Denies HSV GBS- neg VE -yesterday 3 cm

## 2023-04-14 NOTE — Progress Notes (Signed)
 Diagnosis: Iron Deficiency Anemia  Provider:   Shawna Clamp CNM  Procedure: IV Infusion  IV Type: Peripheral, IV Location: L Hand  Venofer (Iron Sucrose), Dose: 500 mg  Infusion Start Time: 0949  Infusion Stop Time: 1330  Post Infusion IV Care: Observation period completed and Peripheral IV Discontinued  Discharge: Condition: Good, Destination: Home . AVS Provided  Performed by:  [name], RN

## 2023-04-15 ENCOUNTER — Inpatient Hospital Stay (HOSPITAL_COMMUNITY): Admitting: Anesthesiology

## 2023-04-15 DIAGNOSIS — J45909 Unspecified asthma, uncomplicated: Secondary | ICD-10-CM | POA: Diagnosis not present

## 2023-04-15 DIAGNOSIS — Z8249 Family history of ischemic heart disease and other diseases of the circulatory system: Secondary | ICD-10-CM | POA: Diagnosis not present

## 2023-04-15 DIAGNOSIS — O9882 Other maternal infectious and parasitic diseases complicating childbirth: Secondary | ICD-10-CM | POA: Diagnosis not present

## 2023-04-15 DIAGNOSIS — O26893 Other specified pregnancy related conditions, third trimester: Secondary | ICD-10-CM | POA: Diagnosis not present

## 2023-04-15 DIAGNOSIS — Z5982 Transportation insecurity: Secondary | ICD-10-CM | POA: Diagnosis not present

## 2023-04-15 DIAGNOSIS — Z3A39 39 weeks gestation of pregnancy: Secondary | ICD-10-CM | POA: Diagnosis not present

## 2023-04-15 DIAGNOSIS — Z560 Unemployment, unspecified: Secondary | ICD-10-CM | POA: Diagnosis not present

## 2023-04-15 DIAGNOSIS — O134 Gestational [pregnancy-induced] hypertension without significant proteinuria, complicating childbirth: Secondary | ICD-10-CM | POA: Diagnosis not present

## 2023-04-15 DIAGNOSIS — O9952 Diseases of the respiratory system complicating childbirth: Secondary | ICD-10-CM | POA: Diagnosis not present

## 2023-04-15 DIAGNOSIS — O368390 Maternal care for abnormalities of the fetal heart rate or rhythm, unspecified trimester, not applicable or unspecified: Secondary | ICD-10-CM | POA: Diagnosis present

## 2023-04-15 DIAGNOSIS — O9902 Anemia complicating childbirth: Secondary | ICD-10-CM | POA: Diagnosis not present

## 2023-04-15 DIAGNOSIS — O326XX0 Maternal care for compound presentation, not applicable or unspecified: Secondary | ICD-10-CM | POA: Diagnosis not present

## 2023-04-15 LAB — CBC
HCT: 24.2 % — ABNORMAL LOW (ref 36.0–46.0)
Hemoglobin: 7.7 g/dL — ABNORMAL LOW (ref 12.0–15.0)
MCH: 25.9 pg — ABNORMAL LOW (ref 26.0–34.0)
MCHC: 31.8 g/dL (ref 30.0–36.0)
MCV: 81.5 fL (ref 80.0–100.0)
Platelets: 170 10*3/uL (ref 150–400)
RBC: 2.97 MIL/uL — ABNORMAL LOW (ref 3.87–5.11)
RDW: 16.9 % — ABNORMAL HIGH (ref 11.5–15.5)
WBC: 7.2 10*3/uL (ref 4.0–10.5)
nRBC: 0.7 % — ABNORMAL HIGH (ref 0.0–0.2)

## 2023-04-15 LAB — RPR: RPR Ser Ql: NONREACTIVE

## 2023-04-15 LAB — TYPE AND SCREEN
ABO/RH(D): O POS
Antibody Screen: NEGATIVE

## 2023-04-15 MED ORDER — LIDOCAINE HCL (PF) 1 % IJ SOLN: 30.0000 mL | INTRAMUSCULAR | Status: DC | PRN

## 2023-04-15 MED ORDER — BENZOCAINE-MENTHOL 20-0.5 % EX AERO: 1.0000 | INHALATION_SPRAY | CUTANEOUS | Status: DC | PRN

## 2023-04-15 MED ORDER — LACTATED RINGERS IV SOLN
500.0000 mL | INTRAVENOUS | Status: DC | PRN
  Administered 2023-04-15: 1000 mL via INTRAVENOUS
  Administered 2023-04-15: 500 mL via INTRAVENOUS

## 2023-04-15 MED ORDER — TERBUTALINE SULFATE 1 MG/ML IJ SOLN: 0.2500 mg | Freq: Once | INTRAMUSCULAR | Status: DC | PRN

## 2023-04-15 MED ORDER — OXYCODONE-ACETAMINOPHEN 5-325 MG PO TABS: 1.0000 | ORAL_TABLET | ORAL | Status: DC | PRN

## 2023-04-15 MED ORDER — DIPHENHYDRAMINE HCL 25 MG PO CAPS: 25.0000 mg | ORAL_CAPSULE | Freq: Four times a day (QID) | ORAL | Status: DC | PRN

## 2023-04-15 MED ORDER — ONDANSETRON HCL 4 MG PO TABS: 4.0000 mg | ORAL_TABLET | ORAL | Status: DC | PRN

## 2023-04-15 MED ORDER — OXYTOCIN-SODIUM CHLORIDE 30-0.9 UT/500ML-% IV SOLN
1.0000 m[IU]/min | INTRAVENOUS | Status: DC
  Administered 2023-04-15: 2 m[IU]/min via INTRAVENOUS
  Administered 2023-04-15: 10 m[IU]/min via INTRAVENOUS
  Filled 2023-04-15: qty 500

## 2023-04-15 MED ORDER — OXYTOCIN BOLUS FROM INFUSION
333.0000 mL | Freq: Once | INTRAVENOUS | Status: AC
  Administered 2023-04-15: 333 mL via INTRAVENOUS

## 2023-04-15 MED ORDER — ONDANSETRON HCL 4 MG/2ML IJ SOLN
4.0000 mg | Freq: Four times a day (QID) | INTRAMUSCULAR | Status: DC | PRN
  Administered 2023-04-15: 4 mg via INTRAVENOUS
  Filled 2023-04-15: qty 2

## 2023-04-15 MED ORDER — DIBUCAINE (PERIANAL) 1 % EX OINT: 1.0000 | TOPICAL_OINTMENT | CUTANEOUS | Status: DC | PRN

## 2023-04-15 MED ORDER — FERROUS SULFATE 325 (65 FE) MG PO TABS
325.0000 mg | ORAL_TABLET | ORAL | Status: DC
Start: 2023-04-16 — End: 2023-04-17
  Administered 2023-04-16: 325 mg via ORAL
  Filled 2023-04-15: qty 1

## 2023-04-15 MED ORDER — LIDOCAINE HCL (PF) 1 % IJ SOLN
INTRAMUSCULAR | Status: DC | PRN
  Administered 2023-04-15: 8 mL via EPIDURAL

## 2023-04-15 MED ORDER — DIPHENHYDRAMINE HCL 50 MG/ML IJ SOLN: 12.5000 mg | INTRAMUSCULAR | Status: DC | PRN

## 2023-04-15 MED ORDER — FENTANYL-BUPIVACAINE-NACL 0.5-0.125-0.9 MG/250ML-% EP SOLN
12.0000 mL/h | EPIDURAL | Status: DC | PRN
  Administered 2023-04-15 (×2): 12 mL/h via EPIDURAL
  Filled 2023-04-15 (×2): qty 250

## 2023-04-15 MED ORDER — EPHEDRINE 5 MG/ML INJ: 10.0000 mg | INTRAVENOUS | Status: DC | PRN

## 2023-04-15 MED ORDER — PHENYLEPHRINE 80 MCG/ML (10ML) SYRINGE FOR IV PUSH (FOR BLOOD PRESSURE SUPPORT): 80.0000 ug | PREFILLED_SYRINGE | INTRAVENOUS | Status: DC | PRN

## 2023-04-15 MED ORDER — MEDROXYPROGESTERONE ACETATE 150 MG/ML IM SUSP: 150.0000 mg | INTRAMUSCULAR | Status: DC | PRN

## 2023-04-15 MED ORDER — ACETAMINOPHEN 500 MG PO TABS
1000.0000 mg | ORAL_TABLET | Freq: Three times a day (TID) | ORAL | Status: DC
  Administered 2023-04-15 – 2023-04-17 (×5): 1000 mg via ORAL
  Filled 2023-04-15 (×6): qty 2

## 2023-04-15 MED ORDER — OXYCODONE HCL 5 MG PO TABS: 10.0000 mg | ORAL_TABLET | Freq: Four times a day (QID) | ORAL | Status: DC | PRN

## 2023-04-15 MED ORDER — LACTATED RINGERS IV SOLN: INTRAVENOUS | Status: DC

## 2023-04-15 MED ORDER — PHENYLEPHRINE 80 MCG/ML (10ML) SYRINGE FOR IV PUSH (FOR BLOOD PRESSURE SUPPORT)
80.0000 ug | PREFILLED_SYRINGE | INTRAVENOUS | Status: DC | PRN
  Filled 2023-04-15: qty 10

## 2023-04-15 MED ORDER — OXYCODONE-ACETAMINOPHEN 5-325 MG PO TABS: 2.0000 | ORAL_TABLET | ORAL | Status: DC | PRN

## 2023-04-15 MED ORDER — FENTANYL-BUPIVACAINE-NACL 0.5-0.125-0.9 MG/250ML-% EP SOLN: 12.0000 mL/h | EPIDURAL | Status: DC | PRN

## 2023-04-15 MED ORDER — OXYTOCIN-SODIUM CHLORIDE 30-0.9 UT/500ML-% IV SOLN
2.5000 [IU]/h | INTRAVENOUS | Status: DC
  Filled 2023-04-15: qty 500

## 2023-04-15 MED ORDER — PRENATAL MULTIVITAMIN CH
1.0000 | ORAL_TABLET | Freq: Every day | ORAL | Status: DC
  Administered 2023-04-16 – 2023-04-17 (×2): 1 via ORAL
  Filled 2023-04-15 (×2): qty 1

## 2023-04-15 MED ORDER — OXYCODONE HCL 5 MG PO TABS: 5.0000 mg | ORAL_TABLET | Freq: Four times a day (QID) | ORAL | Status: DC | PRN

## 2023-04-15 MED ORDER — ZOLPIDEM TARTRATE 5 MG PO TABS: 5.0000 mg | ORAL_TABLET | Freq: Every evening | ORAL | Status: DC | PRN

## 2023-04-15 MED ORDER — LACTATED RINGERS AMNIOINFUSION: INTRAVENOUS | Status: DC

## 2023-04-15 MED ORDER — TRANEXAMIC ACID-NACL 1000-0.7 MG/100ML-% IV SOLN
1000.0000 mg | Freq: Once | INTRAVENOUS | Status: AC
  Administered 2023-04-15: 1000 mg via INTRAVENOUS

## 2023-04-15 MED ORDER — DIPHENHYDRAMINE HCL 50 MG/ML IJ SOLN
25.0000 mg | Freq: Once | INTRAMUSCULAR | Status: AC
Start: 2023-04-15 — End: 2023-04-15
  Administered 2023-04-15: 25 mg via INTRAVENOUS
  Filled 2023-04-15: qty 1

## 2023-04-15 MED ORDER — ONDANSETRON HCL 4 MG/2ML IJ SOLN: 4.0000 mg | INTRAMUSCULAR | Status: DC | PRN

## 2023-04-15 MED ORDER — TRANEXAMIC ACID-NACL 1000-0.7 MG/100ML-% IV SOLN
INTRAVENOUS | Status: AC
Start: 2023-04-15 — End: 2023-04-15
  Filled 2023-04-15: qty 100

## 2023-04-15 MED ORDER — IBUPROFEN 800 MG PO TABS
800.0000 mg | ORAL_TABLET | Freq: Three times a day (TID) | ORAL | Status: DC
  Administered 2023-04-15 – 2023-04-17 (×5): 800 mg via ORAL
  Filled 2023-04-15 (×6): qty 1

## 2023-04-15 MED ORDER — SENNOSIDES-DOCUSATE SODIUM 8.6-50 MG PO TABS
2.0000 | ORAL_TABLET | Freq: Every day | ORAL | Status: DC
  Administered 2023-04-16 – 2023-04-17 (×2): 2 via ORAL
  Filled 2023-04-15 (×2): qty 2

## 2023-04-15 MED ORDER — COCONUT OIL OIL: 1.0000 | TOPICAL_OIL | Status: DC | PRN

## 2023-04-15 MED ORDER — ACETAMINOPHEN 325 MG PO TABS: 650.0000 mg | ORAL_TABLET | ORAL | Status: DC | PRN

## 2023-04-15 MED ORDER — SIMETHICONE 80 MG PO CHEW: 80.0000 mg | CHEWABLE_TABLET | ORAL | Status: DC | PRN

## 2023-04-15 MED ORDER — LACTATED RINGERS IV SOLN
500.0000 mL | Freq: Once | INTRAVENOUS | Status: AC
  Administered 2023-04-15: 500 mL via INTRAVENOUS

## 2023-04-15 MED ORDER — LACTATED RINGERS IV SOLN: 500.0000 mL | Freq: Once | INTRAVENOUS | Status: DC

## 2023-04-15 MED ORDER — SOD CITRATE-CITRIC ACID 500-334 MG/5ML PO SOLN: 30.0000 mL | ORAL | Status: DC | PRN

## 2023-04-15 MED ORDER — WITCH HAZEL-GLYCERIN EX PADS: 1.0000 | MEDICATED_PAD | CUTANEOUS | Status: DC | PRN

## 2023-04-15 NOTE — Progress Notes (Signed)
 LABOR PROGRESS NOTE  Patient Name: Rose Arnold, female   DOB: 2000/09/15, 23 y.o.  MRN: [id number]  To bedside to assess patient. Cervix 8/80/-1 swollen 6 to 12 o'clock. This is unchanged from RN exam at 1445 and 1700. S/p benadryl, many position changes. IUPC in place. Pit at 10 U. Has been having MVUs 180-210 since 1502. ~3.5 hours. Continued Cat II strip with moderate variability and accels. Discussed with patient that we are hopeful for vaginal delivery given multiparous status, however she has not made change despite 3.5 hours of adequate contractions. Will reassess patient in 2 hours.  Joanne Gavel, MD

## 2023-04-15 NOTE — MAU Note (Signed)

## 2023-04-15 NOTE — Anesthesia Preprocedure Evaluation (Signed)
 Anesthesia Evaluation  Patient identified by MRN, date of birth, ID band Patient awake    Reviewed: Allergy & Precautions, H&P , NPO status , Patient's Chart, lab work & pertinent test results, reviewed documented beta blocker date and time   Airway Mallampati: II  TM Distance: >3 FB Neck ROM: full    Dental no notable dental hx. (+) Teeth Intact, Dental Advisory Given   Pulmonary neg pulmonary ROS   Pulmonary exam normal breath sounds clear to auscultation       Cardiovascular negative cardio ROS Normal cardiovascular exam Rhythm:regular Rate:Normal     Neuro/Psych negative neurological ROS  negative psych ROS   GI/Hepatic negative GI ROS, Neg liver ROS,,,  Endo/Other  negative endocrine ROS    Renal/GU negative Renal ROS  negative genitourinary   Musculoskeletal   Abdominal   Peds  Hematology negative hematology ROS (+) Blood dyscrasia, anemia   Anesthesia Other Findings   Reproductive/Obstetrics (+) Pregnancy                             Anesthesia Physical Anesthesia Plan  ASA: 2  Anesthesia Plan: Epidural   Post-op Pain Management: Minimal or no pain anticipated   Induction: Intravenous  PONV Risk Score and Plan: 2  Airway Management Planned: Natural Airway  Additional Equipment: Fetal Monitoring  Intra-op Plan:   Post-operative Plan: Extubation in OR  Informed Consent: I have reviewed the patients History and Physical, chart, labs and discussed the procedure including the risks, benefits and alternatives for the proposed anesthesia with the patient or authorized representative who has indicated his/her understanding and acceptance.       Plan Discussed with: Anesthesiologist and CRNA  Anesthesia Plan Comments:        Anesthesia Quick Evaluation

## 2023-04-15 NOTE — Lactation Note (Signed)
 This note was copied from a baby's chart. Lactation Consultation Note  Patient Name: Rose Arnold Today's Date: 04/15/2023 Age:23 hours   P2- MOB requests to be seen by [facility] services tomorrow per RN. [facility] team will consult with MOB tomorrow.  Dema Severin BS, IBCLC 04/15/2023, 10:25 PM

## 2023-04-15 NOTE — Progress Notes (Signed)
 LABOR PROGRESS NOTE  Patient Name: Rose Arnold, female   DOB: March 17, 2000, 23 y.o.  MRN: [id number]  Reviewed FHT. Baseline 120/moderate variability/accels/late and variable decelerations. Currently Cat II. Contractions near adequate and has made cervical change with pitocin. Now 7.5/80/-2. Continue amnioinfusion. As long as variability remains, will proceed with pitocin and augmentation. Anticipate NSVD given multiparous status and advanced dilation.  Joanne Gavel, MD

## 2023-04-15 NOTE — Discharge Summary (Signed)
 Postpartum Discharge Summary  Date of Service updated***     Patient Name: Rose Arnold DOB: [birth year] MRN: [id number]  Date of admission: 04/14/2023 Delivery date:04/15/2023 Delivering provider: Joanne Gavel Date of discharge: 04/15/2023  Admitting diagnosis: Variable fetal heart rate decelerations, antepartum [O36.8390] Intrauterine pregnancy: [redacted]w[redacted]d     Secondary diagnosis:  Principal Problem:   SVD (spontaneous vaginal delivery) Active Problems:   Asthma, chronic   Anemia in pregnancy   Encounter for supervision of normal pregnancy, antepartum   Pyelonephritis affecting pregnancy   Variable fetal heart rate decelerations, antepartum  Additional problems: ***    Discharge diagnosis: Term Pregnancy Delivered and Anemia                                              Post partum procedures:{Postpartum procedures:23558} Augmentation: AROM and Pitocin Complications: None  Hospital course: Onset of Labor With Vaginal Delivery      23 y.o. yo G2P1001 at [redacted]w[redacted]d was admitted in Latent Labor on 04/14/2023 for variable decelerations on monitoring. Labor course was complicated by prolonged active stage. Found to be 2/2 compound hand presentation.  Membrane Rupture Time/Date: 1:44 AM,04/15/2023  Delivery Method:Vaginal, Spontaneous Operative Delivery:N/A Episiotomy: None Lacerations:  None Patient had a postpartum course complicated by ***.  She is ambulating, tolerating a regular diet, passing flatus, and urinating well. Patient is discharged home in stable condition on 04/15/23.  Newborn Data: Birth date:04/15/2023 Birth time:7:07 PM Gender:Female Living status:Living Apgars:8 ,9  Weight:   Magnesium Sulfate received: No BMZ received: No Rhophylac:No MMR:No T-DaP:Given prenatally Flu: Yes RSV Vaccine received: Yes Transfusion:{Transfusion received:30440034}  Immunizations received: Immunization History  Administered Date(s) Administered   DTaP 05/04/2000, 09/20/2000,  10/23/2000, 02/25/2002, 12/21/2004   HIB (PRP-OMP) 05/04/2000, 09/20/2000, 10/23/2000, 02/20/2001   HPV 9-valent 09/14/2015   HPV Quadrivalent 09/12/2014   Hepatitis A, Ped/Adol-2 Dose 09/12/2014, 09/14/2015   Hepatitis B 05-04-2000, 05/04/2000, 10/23/2000   IPV 05/04/2000, 09/20/2000, 02/25/2002, 12/21/2004   Influenza, Seasonal, Injecte, Preservative Fre 03/02/2023   Influenza,inj,Quad PF,6+ Mos 10/25/2012, 11/30/2016, 12/24/2019   MMR 02/20/2001, 12/21/2004   Meningococcal Conjugate 08/23/2011, 12/06/2016   Pneumococcal Conjugate-13 05/04/2000, 09/20/2000   Rsv, Bivalent, Protein Subunit Rsvpref,pf Verdis Frederickson) 03/02/2023   Tdap 08/23/2011, 02/01/2023   Varicella 02/20/2001, 09/14/2015    Physical exam  Vitals:   04/15/23 1731 04/15/23 1800 04/15/23 1831 04/15/23 1901  BP: 106/72 116/63 119/70 121/66  Pulse: 67 67 77 78  Resp: 16 20 18 20   Temp:      TempSrc:      SpO2:      Weight:      Height:       General: {Exam; general:21111117} Lochia: {Desc; appropriate/inappropriate:30686::"appropriate"} Uterine Fundus: {Desc; firm/soft:30687} Incision: {Exam; incision:21111123} DVT Evaluation: {Exam; dvt:2111122} Labs: Lab Results  Component Value Date   WBC 7.2 04/15/2023   HGB 7.7 (L) 04/15/2023   HCT 24.2 (L) 04/15/2023   MCV 81.5 04/15/2023   PLT 170 04/15/2023      Latest Ref Rng & Units 10/05/2022    9:23 PM  CMP  Glucose 70 - 99 mg/dL 79   BUN 6 - 20 mg/dL 6   Creatinine 0.10 - 2.72 mg/dL 5.36   Sodium 644 - 034 mmol/L 131   Potassium 3.5 - 5.1 mmol/L 3.8   Chloride 98 - 111 mmol/L 100   CO2 22 - 32 mmol/L  21   Calcium 8.9 - 10.3 mg/dL 9.2   Total Protein 6.5 - 8.1 g/dL 7.5   Total Bilirubin 0.3 - 1.2 mg/dL 0.8   Alkaline Phos 38 - 126 U/L 51   AST 15 - 41 U/L 22   ALT 0 - 44 U/L 19    Edinburgh Score:    01/27/2020    1:35 PM  Edinburgh Postnatal Depression Scale Screening Tool  I have been able to laugh and see the funny side of things. 0  I have  looked forward with enjoyment to things. 0  I have blamed myself unnecessarily when things went wrong. 0  I have been anxious or worried for no good reason. 0  I have felt scared or panicky for no good reason. 0  Things have been getting on top of me. 0  I have been so unhappy that I have had difficulty sleeping. 0  I have felt sad or miserable. 0  I have been so unhappy that I have been crying. 0  The thought of harming myself has occurred to me. 0  Edinburgh Postnatal Depression Scale Total 0   No data recorded  After visit meds:  Allergies as of 04/15/2023   No Known Allergies   Med Rec must be completed prior to using this [facility]***        Discharge home in stable condition Infant Feeding: {Baby feeding:23562} Infant Disposition:{CHL IP OB HOME WITH WGNFAO:13086} Discharge instruction: per After Visit Summary and Postpartum booklet. Activity: Advance as tolerated. Pelvic rest for 6 weeks.  Diet: {OB VHQI:69629528} Future Appointments: Future Appointments  Date Time Provider Department Center  04/20/2023 10:50 AM [facility] NURSE [facility]  04/20/2023 11:10 AM Cresenzo-Dishmon, Scarlette Calico, CNM [facility]   Follow up Visit:  Message sent to FT 3/1  Please schedule this patient for a In person postpartum visit in 6 weeks with the following provider: Any provider. Additional Postpartum F/U: None   Low risk pregnancy complicated by:  anemia Delivery mode:  Vaginal, Spontaneous Anticipated Birth Control:   Nexplanon   04/15/2023 Joanne Gavel, MD

## 2023-04-15 NOTE — Progress Notes (Signed)
 LABOR PROGRESS NOTE  Patient Name: Rose Arnold, female   DOB: 18-May-2000, 23 y.o.  MRN: [id number]  Introduced myself to patient. Her contractions have significantly spaced since epidural. Babe remains Cat II with shallow variables, moderate variability, and accels. Has IUPC in place for amnioinfusion. Is amenable to starting pit. Start 2x2.  Joanne Gavel, MD

## 2023-04-15 NOTE — Anesthesia Procedure Notes (Signed)
 Epidural Patient location during procedure: OB Start time: 04/15/2023 2:50 AM End time: 04/15/2023 2:53 AM  Staffing Anesthesiologist: Bethena Midget, MD  Preanesthetic Checklist Completed: patient identified, IV checked, site marked, risks and benefits discussed, surgical consent, monitors and equipment checked, pre-op evaluation and timeout performed  Epidural Patient position: sitting Prep: DuraPrep and site prepped and draped Patient monitoring: continuous pulse ox and blood pressure Approach: midline Location: L4-L5 Injection technique: LOR air  Needle:  Needle type: Tuohy  Needle gauge: 17 G Needle length: 9 cm and 9 Needle insertion depth: 6 cm Catheter type: closed end flexible Catheter size: 19 Gauge Catheter at skin depth: 12 cm Test dose: negative  Assessment Events: blood not aspirated, no cerebrospinal fluid, injection not painful, no injection resistance, no paresthesia and negative IV test

## 2023-04-15 NOTE — H&P (Signed)
 OBSTETRIC ADMISSION HISTORY AND PHYSICAL  Rose Arnold is a 23 y.o. female G2P1001 with IUP at [redacted]w[redacted]d (dated by L/8, Estimated Date of Delivery: 04/18/23) presenting for latent labor.   She reports +FMs, No LOF, no VB, no blurry vision, headaches or peripheral edema, and RUQ pain.    She plans on breast feeding. She request inpatient Nexplanon for birth control.  She received her prenatal [facility] at [facility]   Prenatal History/Complications:  - asthma - anemia of pregnancy  Past Medical History: Past Medical History:  Diagnosis Date   Allergy    Seasonal   Asthma    as a child   Gonorrhea 03/13/2019   Treated 03/13/19 with rocephin and azithromycin:   POC__________   Pre-diabetes     Past Surgical History: Past Surgical History:  Procedure Laterality Date   NO PAST SURGERIES      Obstetrical History: OB History     Gravida  2   Para  1   Term  1   Preterm  0   AB  0   Living  1      SAB  0   IAB  0   Ectopic  0   Multiple  0   Live Births  1           Social History Social History   Socioeconomic History   Marital status: Single    Spouse name: Not on file   Number of children: Not on file   Years of education: Not on file   Highest education level: Not on file  Occupational History   Occupation: unemployed  Tobacco Use   Smoking status: Never   Smokeless tobacco: Never  Vaping Use   Vaping status: Never Used  Substance and Sexual Activity   Alcohol use: Not Currently    Comment: occ   Drug use: No   Sexual activity: Not Currently    Birth control/protection: None  Other Topics Concern   Not on file  Social History Narrative   Lives with Mom, dad and two younger brothers. No one living at home smokes, but Grandma smokes outside when she comes to visit.    Social Drivers of Corporate investment banker Strain: Low Risk  (04/05/2021)   Overall Financial Resource Strain (CARDIA)    Difficulty of Paying Living Expenses: Not hard  at all  Food Insecurity: No Food Insecurity (04/15/2023)   Hunger Vital Sign    Worried About Running Out of Food in the Last Year: Never true    Ran Out of Food in the Last Year: Never true  Transportation Needs: Unmet Transportation Needs (04/15/2023)   PRAPARE - Administrator, Civil Service (Medical): Yes    Lack of Transportation (Non-Medical): Yes  Physical Activity: Insufficiently Active (04/05/2021)   Exercise Vital Sign    Days of Exercise per Week: 1 day    Minutes of Exercise per Session: 10 min  Stress: No Stress Concern Present (04/05/2021)   [employer] of Occupational Health - Occupational Stress Questionnaire    Feeling of Stress : Not at all  Social Connections: Moderately Isolated (04/05/2021)   Social Connection and Isolation Panel [NHANES]    Frequency of Communication with Friends and Family: More than three times a week    Frequency of Social Gatherings with Friends and Family: More than three times a week    Attends Religious Services: 1 to 4 times per year    Active Member  of Clubs or Organizations: No    Attends Banker Meetings: Never    Marital Status: Never married    Family History: Family History  Problem Relation Age of Onset   Asthma Paternal Aunt    Depression Maternal Grandmother    Hypertension Maternal Grandmother    COPD Maternal Grandfather        Hx of bronchitis   Asthma Paternal Grandfather    Healthy Mother     Allergies: No Known Allergies  Medications Prior to Admission  Medication Sig Dispense Refill Last Dose/Taking   Blood Pressure Monitor MISC For regular home bp monitoring during pregnancy 1 each 0    ferrous sulfate 325 (65 FE) MG EC tablet Take 1 tablet (325 mg total) by mouth every other day. (Patient not taking: Reported on 04/13/2023) 30 tablet 3    Prenatal Vit-Fe Fumarate-FA (PRENATAL VITAMINS) 28-0.8 MG TABS Take by mouth.        Review of Systems  All systems reviewed and negative except  as stated in HPI.  Blood pressure 108/67, pulse 72, temperature 97.7 F (36.5 C), temperature source Oral, resp. rate 18, height 4\' 11"  (1.499 m), weight 73.1 kg, last menstrual period 07/12/2022. General appearance: alert and cooperative Lungs: breathing comfortably on room air Heart: regular rate Abdomen: soft, non-tender; gravid Extremities: no edema of bilateral lower extremities Presentation: cephalic Fetal monitoring: 135/mod/+accels/+variables Uterine activity: every 2-4 min Dilation: 4.5 Effacement (%): 70 Station: -2 Exam by:: Eriel Doyon MD   Prenatal labs: ABO, Rh: --/--/O POS (03/01 0030) Antibody: NEG (03/01 0030) Rubella: 1.05 (08/28 1515) RPR: Non Reactive (12/18 0825)  HBsAg: Negative (08/28 1515)  HIV: Non Reactive (12/18 0825)  GBS: Negative/-- (02/20 1630)  2 hr Glucola wnl Genetic screening low risk female Anatomy US normal  Prenatal Transfer Tool  Maternal Diabetes: No Genetic Screening: Normal Maternal Ultrasounds/Referrals: Normal Fetal Ultrasounds or other Referrals:  None Maternal Substance Abuse:  No Significant Maternal Medications:  None Significant Maternal Lab Results:  Group B Strep negative Number of Prenatal Visits:greater than 3 verified prenatal visits Other Comments:  None  Results for orders placed or performed during the hospital encounter of 04/14/23 (from the past 24 hours)  CBC   Collection Time: 04/15/23 12:30 AM  Result Value Ref Range   WBC 7.2 4.0 - 10.5 K/uL   RBC 2.97 (L) 3.87 - 5.11 MIL/uL   Hemoglobin 7.7 (L) 12.0 - 15.0 g/dL   HCT 78.2 (L) 95.6 - 21.3 %   MCV 81.5 80.0 - 100.0 fL   MCH 25.9 (L) 26.0 - 34.0 pg   MCHC 31.8 30.0 - 36.0 g/dL   RDW 08.6 (H) 57.8 - 46.9 %   Platelets 170 150 - 400 K/uL   nRBC 0.7 (H) 0.0 - 0.2 %  Type and screen [facility]   Collection Time: 04/15/23 12:30 AM  Result Value Ref Range   ABO/RH(D) O POS    Antibody Screen NEG    Sample Expiration       04/18/2023,2359 Performed at [facility], [address] [place], [place] [address]     Patient Active Problem List   Diagnosis Date Noted   Variable fetal heart rate decelerations, antepartum 04/15/2023   Fetal malpresentation 04/09/2023   Breech presentation 04/06/2023   Pyelonephritis affecting pregnancy 10/12/2022   Encounter for supervision of normal pregnancy, antepartum 10/11/2022   Anemia in pregnancy 12/24/2019   Asthma, chronic 10/23/2012    Assessment/Plan:  Rose T  Mangan is a 23 y.o. G2P1001 at [redacted]w[redacted]d here for latent labor, admitted persistent cat II strip (variables on EFM).   #Labor: Cat II strip given variables, but moderate variability and overall reassuring  discussed role of AROM with patient and she provided verbal consent  AROM performed with scant amount of bloody/clear fluid, cx feels softer/able to stretch to 5.5cm after AROM  cont positional changes #Pain: Unmedicated #FWB: Cat II, but overall reassuring #ID:  GBS neg #MOF: Breast #MOC: Nexplanon  #Anemia of pregnancy: HgB 7.7 on admit  TXA at delivery  Sundra Aland, MD OB Fellow, Faculty Practice [facility], Center for [facility] 04/15/23 1:57 AM

## 2023-04-15 NOTE — Progress Notes (Signed)
 Labor progress note:  Pt has epidural, comfortable and sleeping. Pt with persistent cat II strip -- having variables, mostly after ctx. IV fluids going, has received three boluses thus far. Overall has moderate variability. Discussed placement of IUPC w pt and role of amnioinfusion. Pt consented to placement. IUPC placed without difficulty, will start amnioinfusion.  Sundra Aland, MD OB Fellow, Faculty Practice [facility], Center for [facility]

## 2023-04-16 ENCOUNTER — Encounter (HOSPITAL_COMMUNITY): Payer: Self-pay | Admitting: Family Medicine

## 2023-04-16 LAB — CBC
HCT: 21.6 % — ABNORMAL LOW (ref 36.0–46.0)
Hemoglobin: 6.9 g/dL — CL (ref 12.0–15.0)
MCH: 25.8 pg — ABNORMAL LOW (ref 26.0–34.0)
MCHC: 31.9 g/dL (ref 30.0–36.0)
MCV: 80.9 fL (ref 80.0–100.0)
Platelets: 151 10*3/uL (ref 150–400)
RBC: 2.67 MIL/uL — ABNORMAL LOW (ref 3.87–5.11)
RDW: 17.4 % — ABNORMAL HIGH (ref 11.5–15.5)
WBC: 13 10*3/uL — ABNORMAL HIGH (ref 4.0–10.5)
nRBC: 0.6 % — ABNORMAL HIGH (ref 0.0–0.2)

## 2023-04-16 MED ORDER — SODIUM CHLORIDE 0.9 % IV SOLN: INTRAVENOUS | Status: DC | PRN

## 2023-04-16 MED ORDER — METHYLPREDNISOLONE SODIUM SUCC 125 MG IJ SOLR: 125.0000 mg | Freq: Once | INTRAMUSCULAR | Status: DC | PRN

## 2023-04-16 MED ORDER — LIDOCAINE HCL 1 % IJ SOLN
0.0000 mL | Freq: Once | INTRAMUSCULAR | Status: DC | PRN
  Filled 2023-04-16: qty 20

## 2023-04-16 MED ORDER — SODIUM CHLORIDE 0.9 % IV BOLUS: 500.0000 mL | Freq: Once | INTRAVENOUS | Status: DC | PRN

## 2023-04-16 MED ORDER — EPINEPHRINE 0.3 MG/0.3ML IJ SOAJ: 0.3000 mg | Freq: Once | INTRAMUSCULAR | Status: DC | PRN

## 2023-04-16 MED ORDER — ALBUTEROL SULFATE (2.5 MG/3ML) 0.083% IN NEBU: 2.5000 mg | INHALATION_SOLUTION | Freq: Once | RESPIRATORY_TRACT | Status: DC | PRN

## 2023-04-16 MED ORDER — IRON SUCROSE 500 MG IVPB - SIMPLE MED: 500.0000 mg | Freq: Once | INTRAVENOUS | Status: DC

## 2023-04-16 MED ORDER — ETONOGESTREL 68 MG ~~LOC~~ IMPL
68.0000 mg | DRUG_IMPLANT | Freq: Once | SUBCUTANEOUS | Status: DC
  Filled 2023-04-16: qty 1

## 2023-04-16 NOTE — Progress Notes (Signed)
 Post Partum Day 1 Subjective: no complaints, up ad lib, voiding, and tolerating PO  Objective: Blood pressure 106/65, pulse 72, temperature 97.7 F (36.5 C), temperature source Oral, resp. rate 18, height 4\' 11"  (1.499 m), weight 73.1 kg, last menstrual period 07/12/2022, SpO2 99%.  Physical Exam:  General: alert, cooperative, and appears stated age Lochia: appropriate Uterine Fundus: firm DVT Evaluation: No evidence of DVT seen on physical exam.  Recent Labs    04/15/23 0030 04/16/23 0532  HGB 7.7* 6.9*  HCT 24.2* 21.6*    Assessment/Plan: Plan for discharge tomorrow, Breastfeeding, and Contraception inpt. Nexplanon On Procardia and lasix due to GHTN   LOS: 1 day   Reva Bores, MD 04/16/2023, 7:17 AM

## 2023-04-16 NOTE — Anesthesia Postprocedure Evaluation (Signed)
 Anesthesia Post Note  Patient: Rose Arnold  Procedure(s) Performed: AN AD HOC LABOR EPIDURAL     Patient location during evaluation: Mother Baby Anesthesia Type: Epidural Level of consciousness: awake and alert Pain management: pain level controlled Vital Signs Assessment: post-procedure vital signs reviewed and stable Respiratory status: spontaneous breathing, nonlabored ventilation and respiratory function stable Cardiovascular status: stable Postop Assessment: no headache, no backache, epidural receding, patient able to bend at knees, no apparent nausea or vomiting, able to ambulate and adequate PO intake Anesthetic complications: no   No notable events documented.  Last Vitals:  Vitals:   04/16/23 0300 04/16/23 0653  BP: 96/66 106/65  Pulse:  72  Resp: 18 18  Temp: 37.2 C 36.5 C  SpO2: 97% 99%    Last Pain:  Vitals:   04/16/23 0802  TempSrc:   PainSc: 0-No pain   Pain Goal: Patients Stated Pain Goal: 0 (04/15/23 0422)                 Laban Emperor

## 2023-04-16 NOTE — Lactation Note (Signed)
 This note was copied from a baby's chart. Lactation Consultation Note  Patient Name: Rose Arnold Today's Date: 04/16/2023 Age:23 hours Reason for consult: Initial assessment;1st time breastfeeding;Primapara;Term  P1, 39 wks, @ 20 hrs of life. LC discussed goals with mom, does want breast feeding to be her primary option- receptive to putting baby to breast. Cross-cradle on left, football on right demonstrated with mom. Demonstrated steps of latch, breaking and re-latching as needed. Infant responds well, highlighted holds and working on big mouth latch. Infant feeds 15 minutes on each breast, still working on second breast with LC exit. Discussed expectations @ breast- Day 1- sleepy/ feed every 3 hrs/ even 10 minutes is okay, Day 2 more awake/ feeding cues/longer feeds, and cluster feeding overnights brings milk in. Highlighted breast stimulation is tied directly to milk production. Discussed hands on breast and baby, keeping baby awake @ breast. Starting with hand expression & breast compression to get baby working @ breast, and gentle stimulation to keep baby working @ breast. Encouraged EBM for nipple care post feed. LC services and milk storage shared. Encouraged mom to call for assist anytime desired.  Maternal Data Has patient been taught Hand Expression?: Yes Does the patient have breastfeeding experience prior to this delivery?: No  Feeding Mother's Current Feeding Choice: Breast Milk and Formula  LATCH Score Latch: Grasps breast easily, tongue down, lips flanged, rhythmical sucking.  Audible Swallowing: Spontaneous and intermittent  Type of Nipple: Everted at rest and after stimulation  Comfort (Breast/Nipple): Soft / non-tender  Hold (Positioning): Assistance needed to correctly position infant at breast and maintain latch.  LATCH Score: 9   Lactation Tools Discussed/Used Tools: Pump Breast pump type: Manual (Hand pump provided) Pump Education: Milk  Storage  Interventions Interventions: Breast feeding basics reviewed;Assisted with latch;Hand express;Breast compression;Support pillows;Position options;Expressed milk;Hand pump;Education;LC Services brochure;CDC milk storage guidelines  Discharge Pump: Manual;Personal (No pump per mom, handout on getting medicaid breast pump through [employer] shared with mom, provided hand pump)  Consult Status Consult Status: Follow-up Date: 04/17/23 Follow-up type: In-patient    [facility] 04/16/2023, 3:39 PM

## 2023-04-17 ENCOUNTER — Encounter (HOSPITAL_COMMUNITY): Payer: Self-pay | Admitting: Family Medicine

## 2023-04-17 MED ORDER — FERROUS SULFATE 325 (65 FE) MG PO TABS: 325.0000 mg | ORAL_TABLET | ORAL | 1 refills | Status: AC

## 2023-04-17 MED ORDER — ACETAMINOPHEN 500 MG PO TABS: 1000.0000 mg | ORAL_TABLET | Freq: Three times a day (TID) | ORAL | 1 refills | Status: AC | PRN

## 2023-04-17 MED ORDER — IBUPROFEN 800 MG PO TABS: 800.0000 mg | ORAL_TABLET | Freq: Three times a day (TID) | ORAL | 1 refills | Status: DC | PRN

## 2023-04-17 MED ORDER — SENNOSIDES-DOCUSATE SODIUM 8.6-50 MG PO TABS: 2.0000 | ORAL_TABLET | Freq: Every evening | ORAL | 1 refills | Status: AC | PRN

## 2023-04-17 NOTE — Lactation Note (Signed)
 This note was copied from a baby's chart. Lactation Consultation Note  Patient Name: Rose Arnold Today's Date: 04/17/2023 Age:23 hours  Reason for consult: Follow-up assessment;Primapara;1st time breastfeeding;Term;Infant weight loss  P1, [redacted]w[redacted]d, 7% weight loss  Mother reports baby just breast fed for 20 min before my arrival. She states baby Rose "Clover" is breastfeeding well and cluster fed last night. Father is bottle feeding baby formula. She took 10 ml. Mother says "baby prefers to breastfeed over the bottle". Mother supplementing after breastfeeding until full milk volume is present. She denies any questions or concerns.  Mom made aware of O/P services, breastfeeding support groups, community resources, and our phone # for post-discharge questions.        Feeding Mother's Current Feeding Choice: Breast Milk and Formula Nipple Type: Slow - flow   Interventions Interventions: Breast feeding basics reviewed;Education  Discharge Discharge Education: Engorgement and breast care;Warning signs for feeding baby Pump: Manual (Mother has resource to contact [employer] for electric pump eligibility) WIC Program: Yes (disussed reaching out to [facility] if an electric pump is needed)  Consult Status Consult Status: Complete Date: 04/17/23    Omar Person 04/17/2023, 10:22 AM

## 2023-04-20 ENCOUNTER — Other Ambulatory Visit: Payer: Medicaid Other

## 2023-04-20 ENCOUNTER — Encounter: Payer: Medicaid Other | Admitting: Advanced Practice Midwife

## 2023-04-24 ENCOUNTER — Telehealth (HOSPITAL_COMMUNITY): Payer: Self-pay | Admitting: *Deleted

## 2023-04-24 NOTE — Telephone Encounter (Signed)
 04/24/2023  Name: [place] T Marsala MRN: [id number] DOB: 18-Nov-2000  Reason for Call:  Transition of Care Hospital Discharge Call  Contact Status: Patient Contact Status: Complete  Language assistant needed: Interpreter Mode: Interpreter Not Needed        Follow-Up Questions: Do You Have Any Concerns About Your Health As You Heal From Delivery?: No Do You Have Any Concerns About Your Infants Health?: No  Edinburgh Postnatal Depression Scale:  In the Past 7 Days: I have been able to laugh and see the funny side of things.: As much as I always could I have looked forward with enjoyment to things.: As much as I ever did I have blamed myself unnecessarily when things went wrong.: Yes, some of the time I have been anxious or worried for no good reason.: Yes, sometimes I have felt scared or panicky for no good reason.: No, not much Things have been getting on top of me.: No, I have been coping as well as ever I have been so unhappy that I have had difficulty sleeping.: Not very often I have felt sad or miserable.: Not very often I have been so unhappy that I have been crying.: Only occasionally The thought of harming myself has occurred to me.: Never Edinburgh Postnatal Depression Scale Total: 8  PHQ2-9 Depression Scale:     Discharge Follow-up: Edinburgh score requires follow up?: No Patient was advised of the following resources:: Support Group, Breastfeeding Support Group  Post-discharge interventions: Reviewed Newborn Safe Sleep Practices  Salena Saner, RN 04/24/2023 14:20

## 2023-05-23 ENCOUNTER — Ambulatory Visit: Admitting: Women's Health

## 2023-08-17 ENCOUNTER — Emergency Department (HOSPITAL_COMMUNITY)
Admission: EM | Admit: 2023-08-17 | Discharge: 2023-08-17 | Disposition: A | Discharge status: Home / Self Care | Attending: Emergency Medicine | Admitting: Emergency Medicine

## 2023-08-17 ENCOUNTER — Emergency Department (HOSPITAL_COMMUNITY)

## 2023-08-17 ENCOUNTER — Other Ambulatory Visit: Payer: Self-pay

## 2023-08-17 ENCOUNTER — Encounter (HOSPITAL_COMMUNITY): Payer: Self-pay

## 2023-08-17 DIAGNOSIS — O3680X0 Pregnancy with inconclusive fetal viability, not applicable or unspecified: Secondary | ICD-10-CM | POA: Diagnosis not present

## 2023-08-17 DIAGNOSIS — O0289 Other abnormal products of conception: Secondary | ICD-10-CM

## 2023-08-17 DIAGNOSIS — J45909 Unspecified asthma, uncomplicated: Secondary | ICD-10-CM | POA: Insufficient documentation

## 2023-08-17 DIAGNOSIS — O209 Hemorrhage in early pregnancy, unspecified: Secondary | ICD-10-CM | POA: Diagnosis not present

## 2023-08-17 DIAGNOSIS — R300 Dysuria: Secondary | ICD-10-CM | POA: Diagnosis not present

## 2023-08-17 DIAGNOSIS — Z3A01 Less than 8 weeks gestation of pregnancy: Secondary | ICD-10-CM | POA: Insufficient documentation

## 2023-08-17 LAB — CBC WITH DIFFERENTIAL/PLATELET
Abs Immature Granulocytes: 0.01 10*3/uL (ref 0.00–0.07)
Basophils Absolute: 0 10*3/uL (ref 0.0–0.1)
Basophils Relative: 0 %
Eosinophils Absolute: 0.1 10*3/uL (ref 0.0–0.5)
Eosinophils Relative: 3 %
HCT: 34.8 % — ABNORMAL LOW (ref 36.0–46.0)
Hemoglobin: 11.5 g/dL — ABNORMAL LOW (ref 12.0–15.0)
Immature Granulocytes: 0 %
Lymphocytes Relative: 41 %
Lymphs Abs: 1.7 10*3/uL (ref 0.7–4.0)
MCH: 29 pg (ref 26.0–34.0)
MCHC: 33 g/dL (ref 30.0–36.0)
MCV: 87.9 fL (ref 80.0–100.0)
Monocytes Absolute: 0.4 10*3/uL (ref 0.1–1.0)
Monocytes Relative: 9 %
Neutro Abs: 2 10*3/uL (ref 1.7–7.7)
Neutrophils Relative %: 47 %
Platelets: 231 10*3/uL (ref 150–400)
RBC: 3.96 MIL/uL (ref 3.87–5.11)
RDW: 13 % (ref 11.5–15.5)
WBC: 4.3 10*3/uL (ref 4.0–10.5)
nRBC: 0 % (ref 0.0–0.2)

## 2023-08-17 LAB — URINALYSIS, ROUTINE W REFLEX MICROSCOPIC
Bilirubin Urine: NEGATIVE
Glucose, UA: NEGATIVE mg/dL
Ketones, ur: 20 mg/dL — AB
Nitrite: NEGATIVE
Protein, ur: 30 mg/dL — AB
Specific Gravity, Urine: 1.013 (ref 1.005–1.030)
WBC, UA: >50 WBC/hpf (ref 0–5)
pH: 7 (ref 5.0–8.0)

## 2023-08-17 LAB — BASIC METABOLIC PANEL WITH GFR
Anion gap: 10 (ref 5–15)
BUN: 8 mg/dL (ref 6–20)
CO2: 23 mmol/L (ref 22–32)
Calcium: 8.9 mg/dL (ref 8.9–10.3)
Chloride: 102 mmol/L (ref 98–111)
Creatinine, Ser: 0.66 mg/dL (ref 0.44–1.00)
GFR, Estimated: >60 mL/min (ref >60)
Glucose, Bld: 85 mg/dL (ref 70–99)
Potassium: 3.3 mmol/L — ABNORMAL LOW (ref 3.5–5.1)
Sodium: 135 mmol/L (ref 135–145)

## 2023-08-17 LAB — HCG, QUANTITATIVE, PREGNANCY: hCG, Beta Chain, Quant, S: 18273 m[IU]/mL — ABNORMAL HIGH (ref <5)

## 2023-08-17 MED ORDER — CEPHALEXIN 500 MG PO CAPS: 500.0000 mg | ORAL_CAPSULE | Freq: Four times a day (QID) | ORAL | 0 refills | Status: DC

## 2023-08-17 NOTE — Discharge Instructions (Signed)
 Your ultrasound today as discussed, shows a failed pregnancy.  It is important that you have close [facility] as you will likely need a repeat ultrasound in the next 1 to 2 weeks.  You may take Tylenol  or ibuprofen  if needed for pain.  Return to the emergency department if you develop any worsening pain or heavy vaginal bleeding.

## 2023-08-17 NOTE — ED Triage Notes (Signed)
 Pt arrived via POV c/o vaginal bleeding X 3 days. Pt reports she took an At-Home pregnancy test a few days ago and reports it was Positive. Pt reports bleeding has gradually worsened and presents today requesting evaluation. Pt reports recently having a child 3 months ago and does report she has been sexually active since then.

## 2023-08-17 NOTE — ED Provider Notes (Addendum)
  EMERGENCY DEPARTMENT AT [facility] Provider Note   CSN: [id number] Arrival date & time: 08/17/23  1551     Patient presents with: Vaginal Bleeding   Rose Arnold is a 23 y.o. female.    Vaginal Bleeding Associated symptoms: abdominal pain   Associated symptoms: no dizziness, no dysuria, no fever, no nausea and no vaginal discharge        Rose Arnold is a 23 y.o. female past medical history of anemia in pregnancy, asthma, prediabetes who presents to the Emergency Department for evaluation of vaginal bleeding.  States that she had vaginal delivery 4 months ago.  Took home pregnancy test 3 days ago was positive.  Developed vaginal bleeding shortly after taking the test.  Describes initially spotting that became heavier vaginal bleeding yesterday.  Past 1 blood clot.  Today bleeding has minimized.  Has not bled through her sanitary pad.  Endorses some mild crampy lower abdominal pain.  No fever chills nausea or vomiting.  Prior to Admission medications   Medication Sig Start Date End Date Taking? Authorizing Provider  acetaminophen  (TYLENOL ) 500 MG tablet Take 2 tablets (1,000 mg total) by mouth every 8 (eight) hours as needed for moderate pain (pain score 4-6). 04/17/23   Nicholaus Almarie HERO, MD  Blood Pressure Monitor MISC For regular home bp monitoring during pregnancy 10/12/22   Loreli Suzen BIRCH, CNM  ferrous sulfate  325 (65 FE) MG tablet Take 1 tablet (325 mg total) by mouth every other day. 04/18/23   Nicholaus Almarie HERO, MD  ibuprofen  (ADVIL ) 800 MG tablet Take 1 tablet (800 mg total) by mouth every 8 (eight) hours as needed for moderate pain (pain score 4-6). 04/17/23   Nicholaus Almarie HERO, MD  Prenatal Vit-Fe Fumarate-FA (PRENATAL VITAMINS) 28-0.8 MG TABS Take by mouth.    [provider]  senna-docusate (SENOKOT-S) 8.6-50 MG tablet Take 2 tablets by mouth at bedtime as needed for mild constipation. 04/17/23   Nicholaus Almarie HERO, MD    Allergies: Patient  has no known allergies.    Review of Systems  Constitutional:  Negative for chills and fever.  Respiratory:  Negative for shortness of breath.   Cardiovascular:  Negative for chest pain.  Gastrointestinal:  Positive for abdominal pain. Negative for nausea and vomiting.  Genitourinary:  Positive for vaginal bleeding. Negative for dysuria, flank pain and vaginal discharge.  Neurological:  Negative for dizziness and weakness.    Updated Vital Signs BP (!) 127/91 (BP Location: Right Arm)   Pulse 77   Temp 98.1 F (36.7 C) (Tympanic)   Resp 16   Ht 4' 11 (1.499 m)   Wt 73 kg   SpO2 100%   BMI 32.51 kg/m   Physical Exam Vitals and nursing note reviewed.  Constitutional:      General: She is not in acute distress.    Appearance: Normal appearance. She is not toxic-appearing.  HENT:     Mouth/Throat:     Mouth: Mucous membranes are moist.  Cardiovascular:     Rate and Rhythm: Normal rate and regular rhythm.     Pulses: Normal pulses.  Pulmonary:     Effort: Pulmonary effort is normal.  Abdominal:     General: There is no distension.     Palpations: Abdomen is soft.     Tenderness: There is no abdominal tenderness. There is no right CVA tenderness, left CVA tenderness, guarding or rebound.  Musculoskeletal:        General: Normal  range of motion.  Skin:    General: Skin is warm.     Capillary Refill: Capillary refill takes less than 2 seconds.  Neurological:     General: No focal deficit present.     Mental Status: She is alert.     Sensory: No sensory deficit.     Motor: No weakness.     (all labs ordered are listed, but only abnormal results are displayed) Labs Reviewed  URINALYSIS, ROUTINE W REFLEX MICROSCOPIC - Abnormal; Notable for the following components:      Result Value   APPearance HAZY (*)    Hgb urine dipstick MODERATE (*)    Ketones, ur 20 (*)    Protein, ur 30 (*)    Leukocytes,Ua MODERATE (*)    Bacteria, UA FEW (*)    Non Squamous Epithelial 0-5  (*)    All other components within normal limits  CBC WITH DIFFERENTIAL/PLATELET - Abnormal; Notable for the following components:   Hemoglobin 11.5 (*)    HCT 34.8 (*)    All other components within normal limits  BASIC METABOLIC PANEL WITH GFR - Abnormal; Notable for the following components:   Potassium 3.3 (*)    All other components within normal limits  HCG, QUANTITATIVE, PREGNANCY - Abnormal; Notable for the following components:   hCG, Beta Chain, Quant, S 18,273 (*)    All other components within normal limits    EKG: None  Radiology: US  OB LESS THAN 14 WEEKS WITH OB TRANSVAGINAL Result Date: 08/17/2023 CLINICAL DATA:  Pregnant patient with bleeding for 2 days. Patient with recurrent pregnancy loss in first trimester. EXAM: OBSTETRIC <14 WK US  AND TRANSVAGINAL OB US  TECHNIQUE: Both transabdominal and transvaginal ultrasound examinations were performed for complete evaluation of the gestation as well as the maternal uterus, adnexal regions, and pelvic cul-de-sac. Transvaginal technique was performed to assess early pregnancy. COMPARISON:  None available. FINDINGS: Intrauterine gestational sac: Single Yolk sac:  Visualized, 7 mm. Embryo:  Visualized. Cardiac Activity: Not Visualized. Heart Rate: 0  bpm CRL:  4.5 mm   6 w   1 d                  US  EDC: 04/10/2024 Subchorionic hemorrhage:  None visualized. Maternal uterus/adnexae: Both ovaries are visualized and are normal. No adnexal mass. No pelvic free fluid. IMPRESSION: 1. Single intrauterine gestation with crown-rump length of 4.5 mm, but no cardiac activity. In conjunction with enlarged yolk sac (7 mm) findings are suspicious but not yet definitive for failed pregnancy. Recommend follow-up US  in 10-14 days for definitive diagnosis. This recommendation follows SRU consensus guidelines: Diagnostic Criteria for Nonviable Pregnancy Early in the First Trimester. LOISE Diedra PARAS Med 2013; 630:8556-48. 2. No subchorionic hemorrhage. Electronically  Signed   By: Andrea Gasman M.D.   On: 08/17/2023 20:25     Procedures   Medications Ordered in the ED - No data to display                                  Medical Decision Making Patient here for evaluation of vaginal bleeding and positive home pregnancy test.  Vaginal delivery 4 months ago.  Began having mild vaginal bleeding 3 days ago that became heavier yesterday with passage of 1 blood clot.  Bleeding has improved since yesterday.  Describes having some mild lower abdominal cramping as well.  On my exam, patient well-appearing nontoxic.  No  significant abdominal tenderness on my exam.  Possible abnormal vaginal bleeding, early pregnancy, threatened miscarriage, ectopic  Amount and/or Complexity of Data Reviewed Labs: ordered.    Details: Labs no leukocytosis, chemistries mild hypokalemia otherwise reassuring  Quant greater than 18,000  Urinalysis shows hazy urine with moderate blood moderate leukocytes greater than 50 white cells and few bacteria.  Urine culture is pending Radiology: ordered.    Details: OB ultrasound shows single intrauterine gestation but no cardiac activity.  Likely failed pregnancy Discussion of management or test interpretation with external provider(s): Patient resting comfortably during ER stay.  Vital signs reassuring.  No appreciable abdominal pain on my exam.  I have explained in detail ultrasound results to the patient that this is likely failed pregnancy given absence of heartbeat.  I have explained that she will need close outpatient follow-up with her OB/GYN and will likely need repeat ultrasound in 1 to 2 weeks.  She verbalized understanding.  Also given strict ER return precautions for worsening pain or heavy vaginal bleeding  Possible acute cystitis, prescription for antibiotic, urine culture pending  Risk Prescription drug management.        Final diagnoses:  Nonviable pregnancy    ED Discharge Orders     None           Herlinda Milling, PA-C 08/17/23 2057    Herlinda Milling, PA-C 08/17/23 2102    Towana Ozell BROCKS, MD 08/18/23 1056

## 2023-08-20 ENCOUNTER — Emergency Department (HOSPITAL_COMMUNITY)
Admission: EM | Admit: 2023-08-20 | Discharge: 2023-08-20 | Disposition: A | Discharge status: Home / Self Care | Attending: Emergency Medicine | Admitting: Emergency Medicine

## 2023-08-20 ENCOUNTER — Other Ambulatory Visit: Payer: Self-pay

## 2023-08-20 ENCOUNTER — Encounter (HOSPITAL_COMMUNITY): Payer: Self-pay | Admitting: *Deleted

## 2023-08-20 ENCOUNTER — Emergency Department (HOSPITAL_COMMUNITY)

## 2023-08-20 DIAGNOSIS — Z3A01 Less than 8 weeks gestation of pregnancy: Secondary | ICD-10-CM | POA: Diagnosis not present

## 2023-08-20 DIAGNOSIS — O039 Complete or unspecified spontaneous abortion without complication: Secondary | ICD-10-CM | POA: Diagnosis not present

## 2023-08-20 DIAGNOSIS — O209 Hemorrhage in early pregnancy, unspecified: Secondary | ICD-10-CM | POA: Diagnosis present

## 2023-08-20 LAB — COMPREHENSIVE METABOLIC PANEL WITH GFR
ALT: 13 U/L (ref 0–44)
AST: 18 U/L (ref 15–41)
Albumin: 3.5 g/dL (ref 3.5–5.0)
Alkaline Phosphatase: 48 U/L (ref 38–126)
Anion gap: 10 (ref 5–15)
BUN: 8 mg/dL (ref 6–20)
CO2: 24 mmol/L (ref 22–32)
Calcium: 8.9 mg/dL (ref 8.9–10.3)
Chloride: 105 mmol/L (ref 98–111)
Creatinine, Ser: 0.68 mg/dL (ref 0.44–1.00)
GFR, Estimated: >60 mL/min (ref >60)
Glucose, Bld: 120 mg/dL — ABNORMAL HIGH (ref 70–99)
Potassium: 3.2 mmol/L — ABNORMAL LOW (ref 3.5–5.1)
Sodium: 139 mmol/L (ref 135–145)
Total Bilirubin: 0.6 mg/dL (ref 0.0–1.2)
Total Protein: 7 g/dL (ref 6.5–8.1)

## 2023-08-20 LAB — CBC WITH DIFFERENTIAL/PLATELET
Abs Immature Granulocytes: 0.02 K/uL (ref 0.00–0.07)
Basophils Absolute: 0 K/uL (ref 0.0–0.1)
Basophils Relative: 0 %
Eosinophils Absolute: 0.1 K/uL (ref 0.0–0.5)
Eosinophils Relative: 2 %
HCT: 31.5 % — ABNORMAL LOW (ref 36.0–46.0)
Hemoglobin: 10.6 g/dL — ABNORMAL LOW (ref 12.0–15.0)
Immature Granulocytes: 0 %
Lymphocytes Relative: 23 %
Lymphs Abs: 1.6 K/uL (ref 0.7–4.0)
MCH: 29.1 pg (ref 26.0–34.0)
MCHC: 33.7 g/dL (ref 30.0–36.0)
MCV: 86.5 fL (ref 80.0–100.0)
Monocytes Absolute: 0.4 K/uL (ref 0.1–1.0)
Monocytes Relative: 5 %
Neutro Abs: 5 K/uL (ref 1.7–7.7)
Neutrophils Relative %: 70 %
Platelets: 231 K/uL (ref 150–400)
RBC: 3.64 MIL/uL — ABNORMAL LOW (ref 3.87–5.11)
RDW: 12.7 % (ref 11.5–15.5)
WBC: 7.1 K/uL (ref 4.0–10.5)
nRBC: 0 % (ref 0.0–0.2)

## 2023-08-20 LAB — HCG, QUANTITATIVE, PREGNANCY: hCG, Beta Chain, Quant, S: 10651 m[IU]/mL — ABNORMAL HIGH (ref <5)

## 2023-08-20 LAB — URINE CULTURE: Culture: >=100000 — AB

## 2023-08-20 MED ORDER — ONDANSETRON HCL 4 MG/2ML IJ SOLN
4.0000 mg | Freq: Once | INTRAMUSCULAR | Status: AC
  Administered 2023-08-20: 4 mg via INTRAVENOUS
  Filled 2023-08-20: qty 2

## 2023-08-20 MED ORDER — IBUPROFEN 800 MG PO TABS: 800.0000 mg | ORAL_TABLET | Freq: Three times a day (TID) | ORAL | 0 refills | Status: AC | PRN

## 2023-08-20 MED ORDER — HYDROMORPHONE HCL 1 MG/ML IJ SOLN
0.5000 mg | Freq: Once | INTRAMUSCULAR | Status: AC
  Administered 2023-08-20: 0.5 mg via INTRAVENOUS
  Filled 2023-08-20: qty 0.5

## 2023-08-20 MED ORDER — SODIUM CHLORIDE 0.9 % IV BOLUS
1000.0000 mL | Freq: Once | INTRAVENOUS | Status: AC
  Administered 2023-08-20: 1000 mL via INTRAVENOUS

## 2023-08-20 NOTE — Discharge Instructions (Signed)
 [facility] planned.  Drink plenty of fluids and rest at home.  If you get worse just return to the emergency department

## 2023-08-20 NOTE — ED Triage Notes (Signed)
 Pt with vaginal bleeding heavy this morning and passing large clots. Pt states she passed out in the BR. Pt states vaginal bleeding started on Monday, seen on Thursday here for it.

## 2023-08-20 NOTE — ED Provider Notes (Signed)
 [place] EMERGENCY DEPARTMENT AT [facility] Provider Note   CSN: [id number] Arrival date & time: 08/20/23  1030     Patient presents with: Vaginal Bleeding   Rose Arnold is a 23 y.o. female.   Patient started with vaginal bleeding today.  She has about [redacted] weeks pregnant.  The history is provided by the patient and medical records. No language interpreter was used.  Vaginal Bleeding Quality:  Bright red Severity:  Moderate Onset quality:  Sudden Timing:  Constant Progression:  Worsening Chronicity:  New Possible pregnancy: yes   Associated symptoms: no abdominal pain, no back pain and no fatigue        Prior to Admission medications   Medication Sig Start Date End Date Taking? Authorizing Provider  ibuprofen  (ADVIL ) 800 MG tablet Take 1 tablet (800 mg total) by mouth every 8 (eight) hours as needed for moderate pain (pain score 4-6). 08/20/23  Yes Suzette Pac, MD  acetaminophen  (TYLENOL ) 500 MG tablet Take 2 tablets (1,000 mg total) by mouth every 8 (eight) hours as needed for moderate pain (pain score 4-6). 04/17/23   Nicholaus Almarie HERO, MD  Blood Pressure Monitor MISC For regular home bp monitoring during pregnancy 10/12/22   Loreli Suzen BIRCH, CNM  cephALEXin  (KEFLEX ) 500 MG capsule Take 1 capsule (500 mg total) by mouth 4 (four) times daily. 08/17/23   Triplett, Tammy, PA-C  ferrous sulfate  325 (65 FE) MG tablet Take 1 tablet (325 mg total) by mouth every other day. 04/18/23   Nicholaus Almarie HERO, MD  Prenatal Vit-Fe Fumarate-FA (PRENATAL VITAMINS) 28-0.8 MG TABS Take by mouth.    [provider]  senna-docusate (SENOKOT-S) 8.6-50 MG tablet Take 2 tablets by mouth at bedtime as needed for mild constipation. 04/17/23   Nicholaus Almarie HERO, MD    Allergies: Patient has no known allergies.    Review of Systems  Constitutional:  Negative for appetite change and fatigue.  HENT:  Negative for congestion, ear discharge and sinus pressure.   Eyes:  Negative for  discharge.  Respiratory:  Negative for cough.   Cardiovascular:  Negative for chest pain.  Gastrointestinal:  Negative for abdominal pain and diarrhea.  Genitourinary:  Positive for vaginal bleeding. Negative for frequency and hematuria.  Musculoskeletal:  Negative for back pain.  Skin:  Negative for rash.  Neurological:  Negative for seizures and headaches.  Psychiatric/Behavioral:  Negative for hallucinations.     Updated Vital Signs BP 114/86   Pulse 80   Temp 97.9 F (36.6 C) (Oral)   Resp 17   Ht 4' 11 (1.499 m)   Wt 73 kg   LMP 07/12/2022   SpO2 100%   BMI 32.51 kg/m   Physical Exam Vitals and nursing note reviewed.  Constitutional:      Appearance: She is well-developed.  HENT:     Head: Normocephalic.     Nose: Nose normal.  Eyes:     General: No scleral icterus.    Conjunctiva/sclera: Conjunctivae normal.  Neck:     Thyroid : No thyromegaly.  Cardiovascular:     Rate and Rhythm: Normal rate and regular rhythm.     Heart sounds: No murmur heard.    No friction rub. No gallop.  Pulmonary:     Breath sounds: No stridor. No wheezing or rales.  Chest:     Chest wall: No tenderness.  Abdominal:     General: There is no distension.     Tenderness: There is abdominal tenderness. There  is no rebound.  Musculoskeletal:        General: Normal range of motion.     Cervical back: Neck supple.  Lymphadenopathy:     Cervical: No cervical adenopathy.  Skin:    Findings: No erythema or rash.  Neurological:     Mental Status: She is alert and oriented to person, place, and time.     Motor: No abnormal muscle tone.     Coordination: Coordination normal.  Psychiatric:        Behavior: Behavior normal.     (all labs ordered are listed, but only abnormal results are displayed) Labs Reviewed  HCG, QUANTITATIVE, PREGNANCY - Abnormal; Notable for the following components:      Result Value   hCG, Beta Chain, Quant, S 10,651 (*)    All other components within normal  limits  CBC WITH DIFFERENTIAL/PLATELET - Abnormal; Notable for the following components:   RBC 3.64 (*)    Hemoglobin 10.6 (*)    HCT 31.5 (*)    All other components within normal limits  COMPREHENSIVE METABOLIC PANEL WITH GFR - Abnormal; Notable for the following components:   Potassium 3.2 (*)    Glucose, Bld 120 (*)    All other components within normal limits    EKG: None  Radiology: US  OB LESS THAN 14 WEEKS WITH OB TRANSVAGINAL Result Date: 08/20/2023 CLINICAL DATA:  First trimester pregnancy with abnormal vaginal bleeding. Patient reports passing clots for 8 hours. Previous abnormal obstetric ultrasound. LMP 06/09/2023. Beta HCG level 10,651 today, 18,273 on 08/17/2023. EXAM: OBSTETRIC <14 WK US  AND TRANSVAGINAL OB US  TECHNIQUE: Both transabdominal and transvaginal ultrasound examinations were performed for complete evaluation of the gestation as well as the maternal uterus, adnexal regions, and pelvic cul-de-sac. Transvaginal technique was performed to assess early pregnancy. COMPARISON:  Obstetrical ultrasound 08/17/2023. FINDINGS: Intrauterine gestational sac: Previously demonstrated gestational sac within the endometrial canal is now abnormally positioned within the endocervical canal, most consistent with an abortion in progress. Yolk sac:  Visualized, 6 mm in diameter. Embryo: In correlation with the recent prior study, there is a probable small embryo, although this is not well visualized. Cardiac Activity: Not demonstrated, as before. MSD: 29.4 mm   8 w   0 d CRL:  3.9 mm   6 w   0 d Subchorionic hemorrhage:  None visualized. Maternal uterus/adnexae: Neither ovary was visualized. No adnexal mass or significant free pelvic fluid. IMPRESSION: 1. Previously demonstrated intrauterine gestational sac is now positioned within the endocervical canal, consistent with an abortion in progress. 2. Poorly visualized small embryo without cardiac motion, consistent with fetal demise in the setting  of a declining serum beta HCG level. Electronically Signed   By: Elsie Perone M.D.   On: 08/20/2023 12:43     Procedures   Medications Ordered in the ED  sodium chloride  0.9 % bolus 1,000 mL (1,000 mLs Intravenous Bolus 08/20/23 1125)  HYDROmorphone  (DILAUDID ) injection 0.5 mg (0.5 mg Intravenous Given 08/20/23 1142)  ondansetron  (ZOFRAN ) injection 4 mg (4 mg Intravenous Given 08/20/23 1141)     I spoke with Dr. Eveline, GYN MD and he stated the patient can be discharged home with [facility]                               Medical Decision Making Amount and/or Complexity of Data Reviewed Labs: ordered. Radiology: ordered.  Risk Prescription drug management.  Patient with miscarriage.  She will rest at home for the next couple days and take Motrin  for pain and follow-up with GYN.     Final diagnoses:  Miscarriage    ED Discharge Orders          Ordered    ibuprofen  (ADVIL ) 800 MG tablet  Every 8 hours PRN        08/20/23 1358               Suzette Pac, MD 08/20/23 1709

## 2023-08-20 NOTE — ED Notes (Signed)
 After ultrasound when patient arrived back to room, pt complaining of 10/10 abd pain, is diaphoretic, hyperventilating. RN notified of change in status

## 2023-08-21 ENCOUNTER — Telehealth (HOSPITAL_BASED_OUTPATIENT_CLINIC_OR_DEPARTMENT_OTHER): Payer: Self-pay | Admitting: *Deleted

## 2023-08-21 NOTE — Telephone Encounter (Signed)
 Post ED Visit - Positive Culture Follow-up  Culture report reviewed by antimicrobial stewardship pharmacist: [facility] [x]  Leonor Bash Pharm.D. []  Venetia Gully, Pharm.D., BCPS AQ-ID []  Garrel Crews, Pharm.D., BCPS []  Almarie Lunger, Pharm.D., BCPS []  [place], [place].D., BCPS, AAHIVP []  Rosaline Bihari, Pharm.D., BCPS, AAHIVP []  Vernell Meier, PharmD, BCPS []  Latanya Hint, PharmD, BCPS []  Donald Medley, PharmD, BCPS []  Rocky Bold, PharmD []  Dorothyann Alert, PharmD, BCPS []  Morene Babe, PharmD  [facility] []  Rosaline Edison, PharmD []  Romona Bliss, PharmD []  Dolphus Roller, PharmD []  Veva Seip, Rph []  Vernell Daunt) Leonce, PharmD []  Eva Allis, PharmD []  Rosaline Millet, PharmD []  Iantha Batch, PharmD []  Arvin Gauss, PharmD []  Wanda Hasting, PharmD []  Ronal Rav, PharmD []  Rocky Slade, PharmD []  Bard Jeans, PharmD   Positive urine culture Treated with cephalexin , organism sensitive to the same and no further patient follow-up is required at this time.  Rose Arnold 08/21/2023, 1:32 PM

## 2023-09-07 ENCOUNTER — Ambulatory Visit (INDEPENDENT_AMBULATORY_CARE_PROVIDER_SITE_OTHER): Admitting: Obstetrics & Gynecology

## 2023-09-07 ENCOUNTER — Encounter: Payer: Self-pay | Admitting: Women's Health

## 2023-09-07 VITALS — BP 118/80 | HR 72 | Ht 59.0 in | Wt 147.0 lb

## 2023-09-07 DIAGNOSIS — O039 Complete or unspecified spontaneous abortion without complication: Secondary | ICD-10-CM | POA: Diagnosis not present

## 2023-09-07 DIAGNOSIS — Z3A08 8 weeks gestation of pregnancy: Secondary | ICD-10-CM

## 2023-09-07 DIAGNOSIS — Z30011 Encounter for initial prescription of contraceptive pills: Secondary | ICD-10-CM

## 2023-09-07 MED ORDER — NORETHINDRONE 0.35 MG PO TABS: 1.0000 | ORAL_TABLET | Freq: Every day | ORAL | 4 refills | Status: AC

## 2023-09-07 NOTE — Progress Notes (Signed)
   GYN VISIT Patient name: Rose Arnold MRN [id number]  Date of birth: 05-16-2000 Chief Complaint:   Miscarriage (/)  History of Present Illness:   Rose Arnold is a 23 y.o. [phone] female being seen today for ED follow-up status post miscarriage.  Patient notes that she is currently breast-feeding continue and realize she was pregnant.  The bleeding started and initially she thought it was her menses however when it got heavier she realized that something was not right.  Notes about 2 weeks ago- heavy bleeding/cramping and states saw products of conception at home.  Bleeding took a few days and has now resolved.  She denies fevers or chills.  She denies vaginal bleeding.  She is interested in a pregnancy in the near future, but not currently  She reports no acute GYN concerns today  Patient's last menstrual period was 07/12/2022.    Review of Systems:   Pertinent items are noted in HPI Denies fever/chills, dizziness, headaches, visual disturbances, fatigue, shortness of breath, chest pain, abdominal pain, vomiting. Pertinent History Reviewed:   Past Surgical History:  Procedure Laterality Date   NO PAST SURGERIES      Past Medical History:  Diagnosis Date   Allergy    Seasonal   Asthma    as a child   Gonorrhea 03/13/2019   Treated 03/13/19 with rocephin  and azithromycin :   POC__________   Pre-diabetes    Reviewed problem list, medications and allergies. Physical Assessment:   Vitals:   09/07/23 1545  BP: 118/80  Pulse: 72  Weight: 147 lb (66.7 kg)  Height: 4' 11 (1.499 m)  Body mass index is 29.69 kg/m.       Physical Examination:   General appearance: alert, well appearing, and in no distress  Psych: mood appropriate, normal affect  Skin: warm & dry   Cardiovascular: normal heart rate noted  Respiratory: normal respiratory effort, no distress  Abdomen: soft, non-tender, no rebound, no guarding  Pelvic: examination not indicated  Extremities: no edema    Chaperone: N/A    Assessment & Plan:  1) recent miscarriage - Based on clinical history suspect complete SAB, will plan for hCG to confirm  2) contraceptive management, breast-feeding - Reviewed options, patient desires a short-term option as they do desire a pregnancy in the near future - Will plan to start on Pops  Meds ordered this encounter  Medications   norethindrone  (MICRONOR ) 0.35 MG tablet    Sig: Take 1 tablet (0.35 mg total) by mouth daily.    Dispense:  90 tablet    Refill:  4    Orders Placed This Encounter  Procedures   Beta hCG quant (ref lab)    Return in about 1 year (around 09/06/2024) for Annual.   Andreea Arca, DO Attending Obstetrician & Gynecologist, [facility], [facility]

## 2023-09-08 ENCOUNTER — Ambulatory Visit: Payer: Self-pay | Admitting: Obstetrics & Gynecology

## 2023-09-08 LAB — BETA HCG QUANT (REF LAB): hCG Quant: 9 m[IU]/mL

## 2024-01-10 ENCOUNTER — Ambulatory Visit (INDEPENDENT_AMBULATORY_CARE_PROVIDER_SITE_OTHER): Admitting: *Deleted

## 2024-01-10 ENCOUNTER — Other Ambulatory Visit (HOSPITAL_COMMUNITY)
Admission: RE | Admit: 2024-01-10 | Discharge: 2024-01-10 | Disposition: A | Discharge status: Home / Self Care | Source: Ambulatory Visit | Attending: Obstetrics & Gynecology | Admitting: Obstetrics & Gynecology

## 2024-01-10 ENCOUNTER — Ambulatory Visit

## 2024-01-10 DIAGNOSIS — N898 Other specified noninflammatory disorders of vagina: Secondary | ICD-10-CM

## 2024-01-10 DIAGNOSIS — Z113 Encounter for screening for infections with a predominantly sexual mode of transmission: Secondary | ICD-10-CM | POA: Diagnosis not present

## 2024-01-10 DIAGNOSIS — R300 Dysuria: Secondary | ICD-10-CM | POA: Diagnosis not present

## 2024-01-10 LAB — POCT URINALYSIS DIPSTICK OB
Blood, UA: NEGATIVE
Glucose, UA: NEGATIVE
Ketones, UA: NEGATIVE
Nitrite, UA: NEGATIVE
POC,PROTEIN,UA: NEGATIVE

## 2024-01-10 NOTE — Progress Notes (Signed)
   NURSE VISIT- VAGINITIS/STD  SUBJECTIVE:  Rose Arnold is a 23 y.o. [id number] GYN patientfemale here for a vaginal swab for vaginitis screening, STD screen.  She reports the following symptoms: burning and vulvar itching for 1 month also complaints of burning with urination.  Denies abnormal vaginal bleeding, significant pelvic pain, fever. Also requesting blood work for [employer].  OBJECTIVE:  There were no vitals taken for this visit.  Appears well, in no apparent distress  ASSESSMENT: Vaginal swab for vaginitis screening Urine Leuks 1+  PLAN: Self-collected vaginal probe for Gonorrhea, Chlamydia, Trichomonas, Bacterial Vaginosis, Yeast sent to lab Urine sent for culture per patient request Treatment: to be determined once results are received Follow-up as needed if symptoms persist/worsen, or new symptoms develop  Aurea Aronov  01/10/2024 11:16 AM

## 2024-01-11 LAB — HIV ANTIBODY (ROUTINE TESTING W REFLEX): HIV Screen 4th Generation wRfx: NONREACTIVE

## 2024-01-11 LAB — SYPHILIS: RPR W/REFLEX TO RPR TITER AND TREPONEMAL ANTIBODIES, TRADITIONAL SCREENING AND DIAGNOSIS ALGORITHM: RPR Ser Ql: NONREACTIVE

## 2024-01-12 LAB — CERVICOVAGINAL ANCILLARY ONLY
Bacterial Vaginitis (gardnerella): POSITIVE — AB
Candida Glabrata: NEGATIVE
Candida Vaginitis: NEGATIVE
Chlamydia: NEGATIVE
Comment: NEGATIVE
Comment: NEGATIVE
Comment: NEGATIVE
Comment: NEGATIVE
Comment: NEGATIVE
Comment: NORMAL
Neisseria Gonorrhea: NEGATIVE
Trichomonas: POSITIVE — AB

## 2024-01-12 LAB — URINE CULTURE

## 2024-01-16 ENCOUNTER — Ambulatory Visit: Payer: Self-pay | Admitting: Women's Health

## 2024-01-16 DIAGNOSIS — A599 Trichomoniasis, unspecified: Secondary | ICD-10-CM | POA: Insufficient documentation

## 2024-01-16 MED ORDER — METRONIDAZOLE 500 MG PO TABS: 500.0000 mg | ORAL_TABLET | Freq: Two times a day (BID) | ORAL | 0 refills | Status: AC
